# Patient Record
Sex: Male | Born: 1965 | Race: Black or African American | Hispanic: No | Marital: Married | Smoking: Current every day smoker
Health system: Southern US, Community
[De-identification: ages and names within clinical notes are randomized; demographics above are authoritative.]

## PROBLEM LIST (undated history)

## (undated) DIAGNOSIS — I10 Essential (primary) hypertension: Secondary | ICD-10-CM

## (undated) HISTORY — PX: APPENDECTOMY: SHX54

---

## 2011-02-23 ENCOUNTER — Emergency Department (HOSPITAL_COMMUNITY)
Admission: EM | Admit: 2011-02-23 | Discharge: 2011-02-23 | Disposition: A | Discharge status: Home / Self Care | Payer: Self-pay | Attending: Emergency Medicine | Admitting: Emergency Medicine

## 2011-02-23 ENCOUNTER — Emergency Department (HOSPITAL_COMMUNITY): Payer: Self-pay

## 2011-02-23 DIAGNOSIS — S139XXA Sprain of joints and ligaments of unspecified parts of neck, initial encounter: Secondary | ICD-10-CM | POA: Insufficient documentation

## 2011-02-23 DIAGNOSIS — M542 Cervicalgia: Secondary | ICD-10-CM | POA: Insufficient documentation

## 2011-02-23 DIAGNOSIS — Y92410 Unspecified street and highway as the place of occurrence of the external cause: Secondary | ICD-10-CM | POA: Insufficient documentation

## 2011-02-23 DIAGNOSIS — M2550 Pain in unspecified joint: Secondary | ICD-10-CM | POA: Insufficient documentation

## 2011-02-23 DIAGNOSIS — M546 Pain in thoracic spine: Secondary | ICD-10-CM | POA: Insufficient documentation

## 2015-09-30 ENCOUNTER — Emergency Department (HOSPITAL_COMMUNITY)
Admission: EM | Admit: 2015-09-30 | Discharge: 2015-09-30 | Disposition: A | Discharge status: Home / Self Care | Payer: 59 | Attending: Emergency Medicine | Admitting: Emergency Medicine

## 2015-09-30 ENCOUNTER — Emergency Department (HOSPITAL_COMMUNITY): Payer: 59

## 2015-09-30 ENCOUNTER — Encounter (HOSPITAL_COMMUNITY): Payer: Self-pay | Admitting: Cardiology

## 2015-09-30 DIAGNOSIS — H9203 Otalgia, bilateral: Secondary | ICD-10-CM | POA: Diagnosis not present

## 2015-09-30 DIAGNOSIS — J029 Acute pharyngitis, unspecified: Secondary | ICD-10-CM | POA: Diagnosis present

## 2015-09-30 DIAGNOSIS — J069 Acute upper respiratory infection, unspecified: Secondary | ICD-10-CM | POA: Diagnosis not present

## 2015-09-30 DIAGNOSIS — R5383 Other fatigue: Secondary | ICD-10-CM | POA: Diagnosis not present

## 2015-09-30 DIAGNOSIS — B9789 Other viral agents as the cause of diseases classified elsewhere: Secondary | ICD-10-CM

## 2015-09-30 DIAGNOSIS — Z720 Tobacco use: Secondary | ICD-10-CM | POA: Insufficient documentation

## 2015-09-30 MED ORDER — AEROCHAMBER PLUS FLO-VU MEDIUM MISC
1.0000 | Freq: Once | Status: AC
  Administered 2015-09-30: 1
  Filled 2015-09-30 (×2): qty 1

## 2015-09-30 MED ORDER — BENZONATATE 100 MG PO CAPS
200.0000 mg | ORAL_CAPSULE | Freq: Once | ORAL | Status: AC
  Administered 2015-09-30: 200 mg via ORAL
  Filled 2015-09-30: qty 2

## 2015-09-30 MED ORDER — BENZONATATE 100 MG PO CAPS: 100.0000 mg | ORAL_CAPSULE | Freq: Three times a day (TID) | ORAL | Status: DC

## 2015-09-30 MED ORDER — ALBUTEROL SULFATE HFA 108 (90 BASE) MCG/ACT IN AERS
2.0000 | INHALATION_SPRAY | RESPIRATORY_TRACT | Status: DC | PRN
  Administered 2015-09-30: 2 via RESPIRATORY_TRACT
  Filled 2015-09-30: qty 6.7

## 2015-09-30 MED ORDER — FLUTICASONE PROPIONATE 50 MCG/ACT NA SUSP: 2.0000 | Freq: Every day | NASAL | Status: DC

## 2015-09-30 MED ORDER — IBUPROFEN 400 MG PO TABS
800.0000 mg | ORAL_TABLET | Freq: Once | ORAL | Status: AC
  Administered 2015-09-30: 800 mg via ORAL
  Filled 2015-09-30: qty 2

## 2015-09-30 NOTE — ED Provider Notes (Signed)
CSN: [id number]     Arrival date & time 09/30/15  1432 History  By signing my name below, I, Elon SpannerGarrett Cook, attest that this documentation has been prepared under the direction and in the presence of TXU CorpHannah Markiesha Delia, PA-C. Electronically Signed: Elon SpannerGarrett Cook ED Scribe. 09/30/2015. 3:32 PM.    Chief Complaint  Patient presents with  . Cough  . Sore Throat   The history is provided by the patient. No language interpreter was used.   HPI Comments: Dylan Holmes is a 49 y.o. male who presents to the Emergency Department complaining of a moderate cough productive of green/yellow/brown sputum onset two days ago; unrelieved by Nyquil.  Associated symptoms include sore throat, bilateral ear pain, rhinorrhea, headache, body aches, and chills.  The patient reports a positive sick contact with similar symtpoms.  Patient is a 4 cigarette/day smoker.  He consumes 4 beers per week but denies illicit drug use.   NKA.  The patient did not receive a flu shot this year.    FAO:ZHYQPCP:none History reviewed. No pertinent past medical history. Past Surgical History  Procedure Laterality Date  . Appendectomy     History reviewed. No pertinent family history. Social History  Substance Use Topics  . Smoking status: Current Every Day Smoker  . Smokeless tobacco: None  . Alcohol Use: Yes    Review of Systems  Constitutional: Positive for fatigue. Negative for fever, chills and appetite change.  HENT: Positive for congestion, ear pain, postnasal drip, rhinorrhea, sinus pressure and sore throat. Negative for ear discharge and mouth sores.   Eyes: Negative for visual disturbance.  Respiratory: Positive for cough and chest tightness. Negative for shortness of breath, wheezing and stridor.   Cardiovascular: Negative for chest pain, palpitations and leg swelling.  Gastrointestinal: Negative for nausea, vomiting, abdominal pain and diarrhea.  Genitourinary: Negative for dysuria, urgency, frequency and hematuria.   Musculoskeletal: Positive for myalgias. Negative for back pain, arthralgias and neck stiffness.  Skin: Negative for rash.  Neurological: Positive for headaches. Negative for syncope, light-headedness and numbness.  Hematological: Negative for adenopathy.  Psychiatric/Behavioral: The patient is not nervous/anxious.   All other systems reviewed and are negative.     Allergies  Review of patient's allergies indicates no known allergies.  Home Medications   Prior to Admission medications   Medication Sig Start Date End Date Taking? Authorizing Provider  benzonatate (TESSALON) 100 MG capsule Take 1 capsule (100 mg total) by mouth every 8 (eight) hours. 09/30/15   Lesle Faron, PA-C  benzonatate (TESSALON) 100 MG capsule Take 1 capsule (100 mg total) by mouth every 8 (eight) hours. 09/30/15   Calden Dorsey, PA-C  fluticasone (FLONASE) 50 MCG/ACT nasal spray Place 2 sprays into both nostrils daily. 09/30/15   Rebbeca Sheperd, PA-C   BP 175/88 mmHg  Pulse 62  Temp(Src) 98 F (36.7 C) (Oral)  Resp 16  Ht 5\' 10"  (1.778 m)  Wt 198 lb 11.2 oz (90.13 kg)  BMI 28.51 kg/m2  SpO2 97% Physical Exam  Constitutional: He is oriented to person, place, and time. He appears well-developed and well-nourished. No distress.  HENT:  Head: Normocephalic and atraumatic.  Right Ear: Tympanic membrane, external ear and ear canal normal.  Left Ear: Tympanic membrane, external ear and ear canal normal.  Nose: Mucosal edema and rhinorrhea present. No epistaxis. Right sinus exhibits no maxillary sinus tenderness and no frontal sinus tenderness. Left sinus exhibits no maxillary sinus tenderness and no frontal sinus tenderness.  Mouth/Throat: Uvula is midline and mucous  membranes are normal. Mucous membranes are not pale and not cyanotic. No oropharyngeal exudate, posterior oropharyngeal edema, posterior oropharyngeal erythema or tonsillar abscesses.  Eyes: Conjunctivae are normal. Pupils are  equal, round, and reactive to light.  Neck: Normal range of motion and full passive range of motion without pain.  Cardiovascular: Normal rate, normal heart sounds and intact distal pulses.   No murmur heard. Pulmonary/Chest: Effort normal and breath sounds normal. No stridor.  Clear breath sounds in the right upper, right lower, left upper lobes with faint wheezes in the left lower.    Abdominal: Soft. Bowel sounds are normal. There is no tenderness.  Musculoskeletal: Normal range of motion.  Lymphadenopathy:    He has no cervical adenopathy.  Neurological: He is alert and oriented to person, place, and time.  Skin: Skin is warm and dry. No rash noted. He is not diaphoretic.  Psychiatric: He has a normal mood and affect.  Nursing note and vitals reviewed.   ED Course  Procedures (including critical care time)  DIAGNOSTIC STUDIES: Oxygen Saturation is 95% on RA, normal by my interpretation.    COORDINATION OF CARE:  3:00 PM Will order CXR, ibuprofen, albuterol, tessalon.  Patient acknowledges and agrees with plan.    Labs Review Labs Reviewed - No data to display  Imaging Review Dg Chest 2 View  09/30/2015  CLINICAL DATA:  Productive cough, history of tobacco use EXAM: CHEST - 2 VIEW COMPARISON:  None. FINDINGS: The heart size and mediastinal contours are within normal limits. Both lungs are clear. The visualized skeletal structures are unremarkable. IMPRESSION: No active disease. Electronically Signed   By: Alcide Clever M.D.   On: 09/30/2015 15:22   I have personally reviewed and evaluated these images and lab results as part of my medical decision-making.   EKG Interpretation None      MDM   Final diagnoses:  Viral URI with cough  Otalgia of both ears   Dylan Hence presents with URI symptoms.  Pt CXR negative for acute infiltrate. Patients symptoms are consistent with URI, likely viral etiology. Discussed that antibiotics are not indicated for viral infections. Pt  will be discharged with symptomatic treatment.  Verbalizes understanding and is agreeable with plan. Pt is hemodynamically stable & in NAD prior to dc.  BP 175/88 mmHg  Pulse 62  Temp(Src) 98 F (36.7 C) (Oral)  Resp 16  Ht  (1.778 m)  Wt 198 lb 11.2 oz (90.13 kg)  BMI 28.51 kg/m2  SpO2 97%  I personally performed the services described in this documentation, which was scribed in my presence. The recorded information has been reviewed and is accurate.   Dahlia Client Kenyonna Micek, PA-C 09/30/15 1607  Jerelyn Scott, MD 10/01/15 [phone]

## 2015-09-30 NOTE — ED Notes (Signed)
Pt stable, ambulatory, states understanding of discharge instructions 

## 2015-09-30 NOTE — ED Notes (Signed)
Reports sore throat, congestion and cough for the past 2 days. Has tried OTC medication without much relief.

## 2015-09-30 NOTE — Discharge Instructions (Signed)
1. Medications: flonase, mucinex, tessalon, albuterol, usual home medications 2. Treatment: rest, drink plenty of fluids, take tylenol or ibuprofen for fever control 3. Follow Up: Please followup with your primary doctor in 3 days for discussion of your diagnoses and further evaluation after today's visit; if you do not have a primary care doctor use the resource guide provided to find one; Return to the ER for high fevers, difficulty breathing or other concerning symptoms   Upper Respiratory Infection, Adult Most upper respiratory infections (URIs) are a viral infection of the air passages leading to the lungs. A URI affects the nose, throat, and upper air passages. The most common type of URI is nasopharyngitis and is typically referred to as "the common cold." URIs run their course and usually go away on their own. Most of the time, a URI does not require medical attention, but sometimes a bacterial infection in the upper airways can follow a viral infection. This is called a secondary infection. Sinus and middle ear infections are common types of secondary upper respiratory infections. Bacterial pneumonia can also complicate a URI. A URI can worsen asthma and chronic obstructive pulmonary disease (COPD). Sometimes, these complications can require emergency medical care and may be life threatening.  CAUSES Almost all URIs are caused by viruses. A virus is a type of germ and can spread from one person to another.  RISKS FACTORS You may be at risk for a URI if:   You smoke.   You have chronic heart or lung disease.  You have a weakened defense (immune) system.   You are very young or very old.   You have nasal allergies or asthma.  You work in crowded or poorly ventilated areas.  You work in health care facilities or schools. SIGNS AND SYMPTOMS  Symptoms typically develop 2-3 days after you come in contact with a cold virus. Most viral URIs last 7-10 days. However, viral URIs from the  influenza virus (flu virus) can last 14-18 days and are typically more severe. Symptoms may include:   Runny or stuffy (congested) nose.   Sneezing.   Cough.   Sore throat.   Headache.   Fatigue.   Fever.   Loss of appetite.   Pain in your forehead, behind your eyes, and over your cheekbones (sinus pain).  Muscle aches.  DIAGNOSIS  Your health care provider may diagnose a URI by:  Physical exam.  Tests to check that your symptoms are not due to another condition such as:  Strep throat.  Sinusitis.  Pneumonia.  Asthma. TREATMENT  A URI goes away on its own with time. It cannot be cured with medicines, but medicines may be prescribed or recommended to relieve symptoms. Medicines may help:  Reduce your fever.  Reduce your cough.  Relieve nasal congestion. HOME CARE INSTRUCTIONS   Take medicines only as directed by your health care provider.   Gargle warm saltwater or take cough drops to comfort your throat as directed by your health care provider.  Use a warm mist humidifier or inhale steam from a shower to increase air moisture. This may make it easier to breathe.  Drink enough fluid to keep your urine clear or pale yellow.   Eat soups and other clear broths and maintain good nutrition.   Rest as needed.   Return to work when your temperature has returned to normal or as your health care provider advises. You may need to stay home longer to avoid infecting others. You can also  use a face mask and careful hand washing to prevent spread of the virus.  Increase the usage of your inhaler if you have asthma.   Do not use any tobacco products, including cigarettes, chewing tobacco, or electronic cigarettes. If you need help quitting, ask your health care provider. PREVENTION  The best way to protect yourself from getting a cold is to practice good hygiene.   Avoid oral or hand contact with people with cold symptoms.   Wash your hands often if  contact occurs.  There is no clear evidence that vitamin C, vitamin E, echinacea, or exercise reduces the chance of developing a cold. However, it is always recommended to get plenty of rest, exercise, and practice good nutrition.  SEEK MEDICAL CARE IF:   You are getting worse rather than better.   Your symptoms are not controlled by medicine.   You have chills.  You have worsening shortness of breath.  You have brown or red mucus.  You have yellow or brown nasal discharge.  You have pain in your face, especially when you bend forward.  You have a fever.  You have swollen neck glands.  You have pain while swallowing.  You have white areas in the back of your throat. SEEK IMMEDIATE MEDICAL CARE IF:   You have severe or persistent:  Headache.  Ear pain.  Sinus pain.  Chest pain.  You have chronic lung disease and any of the following:  Wheezing.  Prolonged cough.  Coughing up blood.  A change in your usual mucus.  You have a stiff neck.  You have changes in your:  Vision.  Hearing.  Thinking.  Mood. MAKE SURE YOU:   Understand these instructions.  Will watch your condition.  Will get help right away if you are not doing well or get worse.   This information is not intended to replace advice given to you by your health care provider. Make sure you discuss any questions you have with your health care provider.   Document Released: 05/22/2001 Document Revised: 04/12/2015 Document Reviewed: 03/03/2014 Elsevier Interactive Patient Education 2016 [employer]    Emergency Department Resource Guide 1) Find a Doctor and Pay Out of Pocket Although you won't have to find out who is covered by your insurance plan, it is a good idea to ask around and get recommendations. You will then need to call the office and see if the doctor you have chosen will accept you as a new patient and what types of options they offer for patients who are self-pay. Some  doctors offer discounts or will set up payment plans for their patients who do not have insurance, but you will need to ask so you aren't surprised when you get to your appointment.  2) Contact Your Local Health Department Not all health departments have doctors that can see patients for sick visits, but many do, so it is worth a call to see if yours does. If you don't know where your local health department is, you can check in your phone book. The CDC also has a tool to help you locate your state's health department, and many state websites also have listings of all of their local health departments.  3) Find a Walk-in Clinic If your illness is not likely to be very severe or complicated, you may want to try a walk in clinic. These are popping up all over the country in pharmacies, drugstores, and shopping centers. They're usually staffed by nurse practitioners or  physician assistants that have been trained to treat common illnesses and complaints. They're usually fairly quick and inexpensive. However, if you have serious medical issues or chronic medical problems, these are probably not your best option.  No Primary Care Doctor: - Call Health Connect at  [phone] - they can help you locate a primary care doctor that  accepts your insurance, provides certain services, etc. - Physician Referral Service- [phone]  Chronic Pain Problems: Organization         Address  Phone   Notes  [facility]  [phone] Patients need to be referred by their primary care doctor.   Medication Assistance: Organization         Address  Phone   Notes  [facility] Medication [facility] [address] [place], [place] [address] [phone] --Must be a resident of [facility] -- Must have NO insurance coverage whatsoever (no Medicaid/ Medicare, etc.) -- The pt. MUST have a primary care doctor that directs their care regularly and follows them in the  community   MedAssist  [phone]   [employer]  [phone]    Agencies that provide inexpensive medical care: Organization         Address  Phone   Notes  [place]  [phone]   [facility]    [phone]   [facility] [place], [place] [address] [phone]   [place] [address], [place] [phone]   Planned Parenthood    [phone]   [facility]    [phone]   [place] and [address], [place] Phone:  [phone], Fax:  [phone] Hours of Operation:  9 am - 6 pm, M-F.  Also accepts Medicaid/Medicare and self-pay.  [facility] for [place], [address], [place] Phone: [phone], Fax: [phone]. Hours of Operation:  8:30 am - 5:30 pm, M-F.  Also accepts Medicaid and self-pay.  [facility] [place] [address], [place] Phone: [phone]   [place], [place], [place] [phone], Ext. [phone] Mondays & Thursdays: 7-9 AM.  First 15 patients are seen on a first come, first serve basis.    [place] Providers:  Organization         Address  Phone   Notes  [facility] [address], [place] [phone] Also accepts self-pay patients.  [facility] [address], [place]  [phone]   [place], [address], [place] [phone]   [facility] [address] [place] [phone]   Lucianne Lei [address], [place]   [phone] Only accepts [place] Access [place] patients after they have their name applied to their card.   Self-Pay (no insurance) in [facility]:  Organization         Address  Phone   Notes  Sickle Cell Patients, [facility]  Internal Medicine [place] [phone]   [facility] [place] [phone]   [facility] [place]  [address], [place] [phone]   [facility]/Dr. Vista Lawman  [address], [place] or [address], [place] [phone] Phone number for both [place] and [place] locations is the same.  Urgent Medical and [facility] [address] [place] [phone]   [facility] [address], [place] or [address] [phone]   [facility] [address], [place] [phone], phone; [phone], fax Sees patients 1st and 3rd Saturday of every month.  Must not qualify for public or private insurance (i.e. Medicaid, Medicare, [place] Health Choice, Veterans' Benefits)  Household income should be no more than 200% of the poverty level The clinic cannot treat you if you are pregnant or think you are pregnant  Sexually transmitted diseases are not treated at the clinic.    Dental Care: Organization         Address  Phone  Notes  [facility] Department of [facility] [place] [phone] Accepts children up to age 81 who are enrolled in [place] or [place]; pregnant women with a Medicaid card; and children who have applied for Medicaid or [place] Health Choice, but were declined, whose parents can pay a reduced fee at time of service.  [facility] Department of [facility]  [address], [place] [phone] Accepts children up to age 25 who are enrolled in [place] or [place]; pregnant women with a Medicaid card; and children who have applied for Medicaid or [place] Health Choice, but were declined, whose parents can pay a reduced fee at time of service.  [place] Adult Dental Access PROGRAM  [place] [phone] Patients are seen by appointment only. Walk-ins are not accepted. [place] will see patients 76 years of age and older. Monday - Tuesday (8am-5pm) Most Wednesdays (8:30-5pm) $30 per visit, cash only  [facility] Adult Dental Access PROGRAM  [address], [facility] [phone] Patients are seen by appointment only. Walk-ins are not accepted. [place] will see patients 53 years of age and older. One Wednesday Evening (Monthly: Volunteer Based).  $30 per visit, cash only  [place]  [phone] for adults; Children under age 26, call Graduate Pediatric Dentistry at [phone]. Children aged 55-14, please call [phone] to request a pediatric application.  Dental services are provided in all areas of dental care including fillings, crowns and bridges, complete and partial dentures, implants, gum treatment, root canals, and extractions. Preventive care is also provided. Treatment is provided to both adults and children. Patients are selected via a lottery and there is often a waiting list.   [facility] [address], [place]  [phone] [email or web address]   [facility] [address] [place], [place] [phone], Ext. [phone] Second and Fourth Thursday of each month, opens at 6:30 AM; Clinic ends at 9 AM.  Patients are seen on a first-come first-served basis, and a limited number are seen during each clinic.   [facility]  [address] Hillard Danker [place], [place] [phone]   Eligibility Requirements You must have lived in [place], [place], or [place] counties for at least the last three months.   You cannot be eligible for state or federal sponsored [employer], including [employer], [place], or [employer].   You generally cannot be eligible for healthcare insurance through your employer.    How to  apply: Eligibility screenings are held every  Tuesday and Wednesday afternoon from 1:00 pm until 4:00 pm. You do not need an appointment for the interview!  [facility] [address] [place], [place]   [place]  [place] Department  [place]  [phone]    Behavioral Health Resources in the Community: Intensive Outpatient Programs Organization         Address  Phone  Notes  [place]. [address] [place], [place] [phone]   [facility] Outpatient [address], [place], [place]   ADS: Alcohol & Drug Svcs [address] [place], [place]   [place] [address],  [place], [place] or [phone]   Substance Abuse Resources Organization         Address  Phone  Notes  Alcohol and Drug Services  [phone]   [place]  [phone]   The [place]   Chinita Pester  [phone]   Residential & Outpatient Substance Abuse Program  [phone]   Psychological Services Organization         Address  Phone  Notes  [facility] [place]  [place]  [phone]   [place] [address], [place] or [phone]    Mobile Crisis Teams Organization         Address  Phone  Notes  [facility], Mobile Crisis Care Unit  [phone]   Assertive Psychotherapeutic Services  [address] [place], [place]   [place] [address] [place] [phone]    Self-Help/Support Groups Organization         Address  Phone             Notes  [place]. of [place] - variety of support groups  [place] Call for more information  Narcotics Anonymous (NA), [facility] [address], [employer] [place]  2 meetings at this location    Special educational needs teacher         Address  Phone  Notes  ASAP Residential Treatment [place],    [place]  [phone]   [facility]  [address] [place] [phone], [place], [place]   [place], [place] [phone] Admissions: 8am-3pm M-F  Incentives Substance [place] [address]    [place], [place] [phone]   [facility] [address] [place], [place], [place]   [facility] [address],  [place], [place]   [facility]ight Programs - Intensive Outpatient [address], [place], [place]   [facility] ([place]  [place], [place] [phone] or [phone]   [facility] Services (RTS) [address] [place], [place] Accepts Medicaid  [facility] [place] [address],  [place] [phone] Substance Abuse/Addiction Treatment   [facility] Organization         Address  Phone  Notes  [facility]  [phone]   Domenic Schwab, PhD [address] Arlis Porta [place], [place]   [phone] or [phone]   [place]   [address] [place], [place] [phone]   [facility] Recovery [place] [address] [place],  [place] [phone] Insurance/Medicaid/sponsorship through [facility] Families [address], Ste [place], [place] [phone] [place], [place] [phone]    Dr. Adele Schilder  [phone]   [facility] Dept. 1) [address], [place] 2) [place] 3)  [place] [address], [place] [phone]  [phone]   [place] [phone] or [phone] (After  Hours)

## 2016-04-15 ENCOUNTER — Emergency Department (HOSPITAL_COMMUNITY)
Admission: EM | Admit: 2016-04-15 | Discharge: 2016-04-15 | Disposition: A | Discharge status: Home / Self Care | Payer: BLUE CROSS/BLUE SHIELD | Attending: Emergency Medicine | Admitting: Emergency Medicine

## 2016-04-15 ENCOUNTER — Encounter (HOSPITAL_COMMUNITY): Payer: Self-pay | Admitting: Emergency Medicine

## 2016-04-15 ENCOUNTER — Emergency Department (HOSPITAL_COMMUNITY): Payer: BLUE CROSS/BLUE SHIELD

## 2016-04-15 DIAGNOSIS — M25512 Pain in left shoulder: Secondary | ICD-10-CM

## 2016-04-15 DIAGNOSIS — F17200 Nicotine dependence, unspecified, uncomplicated: Secondary | ICD-10-CM | POA: Insufficient documentation

## 2016-04-15 DIAGNOSIS — Z79899 Other long term (current) drug therapy: Secondary | ICD-10-CM | POA: Diagnosis not present

## 2016-04-15 DIAGNOSIS — Z7952 Long term (current) use of systemic steroids: Secondary | ICD-10-CM | POA: Insufficient documentation

## 2016-04-15 DIAGNOSIS — M62838 Other muscle spasm: Secondary | ICD-10-CM | POA: Diagnosis not present

## 2016-04-15 MED ORDER — HYDROMORPHONE HCL 1 MG/ML IJ SOLN
1.0000 mg | Freq: Once | INTRAMUSCULAR | Status: AC
  Administered 2016-04-15: 1 mg via INTRAMUSCULAR
  Filled 2016-04-15: qty 1

## 2016-04-15 MED ORDER — CYCLOBENZAPRINE HCL 10 MG PO TABS
5.0000 mg | ORAL_TABLET | Freq: Once | ORAL | Status: AC
  Administered 2016-04-15: 5 mg via ORAL
  Filled 2016-04-15: qty 1

## 2016-04-15 MED ORDER — OXYCODONE-ACETAMINOPHEN 5-325 MG PO TABS
1.0000 | ORAL_TABLET | Freq: Once | ORAL | Status: AC
  Administered 2016-04-15: 1 via ORAL
  Filled 2016-04-15: qty 1

## 2016-04-15 MED ORDER — CYCLOBENZAPRINE HCL 10 MG PO TABS: 10.0000 mg | ORAL_TABLET | Freq: Two times a day (BID) | ORAL | Status: DC | PRN

## 2016-04-15 MED ORDER — HYDROCODONE-ACETAMINOPHEN 5-325 MG PO TABS: 1.0000 | ORAL_TABLET | Freq: Four times a day (QID) | ORAL | Status: DC | PRN

## 2016-04-15 MED ORDER — PREDNISONE 10 MG PO TABS: 20.0000 mg | ORAL_TABLET | Freq: Every day | ORAL | Status: DC

## 2016-04-15 NOTE — ED Notes (Signed)
C/o pain to L shoulder and decreased ROM since Saturday.  Denies known injury.

## 2016-04-15 NOTE — Discharge Instructions (Signed)
Do not take the muscle relaxant or the narcotic if driving or working because they will make you sleepy.  Call tomorrow for follow up with the orthopedic doctor.  Return here as needed.

## 2016-04-15 NOTE — ED Notes (Signed)
Pt continuing to c/o pain, NP notified

## 2016-04-15 NOTE — ED Provider Notes (Signed)
CSN: [id number]     Arrival date & time 04/15/16  1605 History   First MD Initiated Contact with Patient 04/15/16 1629     Chief Complaint  Patient presents with  . Shoulder Pain     (Consider location/radiation/quality/duration/timing/severity/associated sxs/prior Treatment) HPI Dylan Holmes is a 50 y.o. male who presents to the ED with left shoulder pain and decreased range of motion. The symptoms started yesterday. Patient reports he got up and went to work and when he stated home he noted his shoulder hurting and had difficulty raising it to hold the steering wheel. He thought that he thought he slept wrong but the symptoms continued to worsen. He denies chest pain, shortness of breath or other problems. He does not remember any injury to the area.    History reviewed. No pertinent past medical history. Past Surgical History  Procedure Laterality Date  . Appendectomy     No family history on file. Social History  Substance Use Topics  . Smoking status: Current Every Day Smoker  . Smokeless tobacco: None  . Alcohol Use: Yes    Review of Systems  Musculoskeletal:       Left should pain and decreased range of motion  all other systems negative    Allergies  Review of patient's allergies indicates no known allergies.  Home Medications   Prior to Admission medications   Medication Sig Start Date End Date Taking? Authorizing Provider  benzonatate (TESSALON) 100 MG capsule Take 1 capsule (100 mg total) by mouth every 8 (eight) hours. 09/30/15   Hannah Muthersbaugh, PA-C  benzonatate (TESSALON) 100 MG capsule Take 1 capsule (100 mg total) by mouth every 8 (eight) hours. 09/30/15   Hannah Muthersbaugh, PA-C  cyclobenzaprine (FLEXERIL) 10 MG tablet Take 1 tablet (10 mg total) by mouth 2 (two) times daily as needed for muscle spasms. 04/15/16   Hope Orlene OchM Neese, NP  fluticasone (FLONASE) 50 MCG/ACT nasal spray Place 2 sprays into both nostrils daily. 09/30/15   Hannah Muthersbaugh, PA-C   HYDROcodone-acetaminophen (NORCO) 5-325 MG tablet Take 1 tablet by mouth every 6 (six) hours as needed. 04/15/16   Hope Orlene OchM Neese, NP  predniSONE (DELTASONE) 10 MG tablet Take 2 tablets (20 mg total) by mouth daily. 04/15/16   Hope Orlene OchM Neese, NP   BP 141/94 mmHg  Pulse 69  Temp(Src) 98.8 F (37.1 C) (Oral)  Resp 18  Ht 5\' 10"  (1.778 m)  Wt 91.627 kg  BMI 28.98 kg/m2  SpO2 97% Physical Exam  Constitutional: He is oriented to person, place, and time. He appears well-developed and well-nourished.  HENT:  Head: Normocephalic and atraumatic.  Eyes: Conjunctivae and EOM are normal.  Neck: Normal range of motion. Neck supple.  Cardiovascular: Normal rate and regular rhythm.   Pulmonary/Chest: Effort normal and breath sounds normal.  Musculoskeletal:       Left shoulder: He exhibits tenderness and spasm. He exhibits no swelling, no crepitus, no deformity, no laceration, normal pulse and normal strength. Decreased range of motion: due to pain.  Radial pulses 2+, adequate circulation, equal grips.   Neurological: He is alert and oriented to person, place, and time. No cranial nerve deficit.  Skin: Skin is warm and dry.  Psychiatric: He has a normal mood and affect. His behavior is normal.  Nursing note and vitals reviewed.   ED Course  Procedures (including critical care time)  Xray, pain management, sling  Labs Review Labs Reviewed - No data to display  Imaging Review Dg Shoulder  Left  04/15/2016  CLINICAL DATA:  50 year old male with sudden onset of left shoulder pain after work. Moves furniture. Initial encounter. EXAM: LEFT SHOULDER - 2+ VIEW COMPARISON:  None. FINDINGS: No fracture or dislocation. Moderate acromioclavicular joint degenerative changes with prominent spur of the acromion. Mild glenohumeral joint degenerative changes. Visualized lungs clear. IMPRESSION: No fracture or dislocation. Moderate acromioclavicular joint degenerative changes with prominent spur of the acromion. Mild  glenohumeral joint degenerative changes. Electronically Signed   By: Lacy Duverney M.D.   On: 04/15/2016 18:03   I discussed this case and reviewed the x-rays with Dr. Ethelda Chick.   MDM  50 y.o. male with left shoulder pain stable for d/c without focal neuro deficits and no fracture or dislocation noted on x-ray. He will call ortho tomorrow for follow up. He will return here as needed. Discussed with the patient clinical and x-ray findings and plan of care and all questioned fully answered.  Patient reports he is feeling better at d/c.   Final diagnoses:  Left shoulder pain      Janne Napoleon, NP 04/15/16 2015  Doug Sou, MD 04/16/16 [phone]

## 2016-08-03 ENCOUNTER — Encounter (HOSPITAL_COMMUNITY): Payer: Self-pay

## 2016-08-03 ENCOUNTER — Emergency Department (HOSPITAL_COMMUNITY)
Admission: EM | Admit: 2016-08-03 | Discharge: 2016-08-03 | Disposition: A | Discharge status: Home / Self Care | Payer: BLUE CROSS/BLUE SHIELD | Attending: Emergency Medicine | Admitting: Emergency Medicine

## 2016-08-03 DIAGNOSIS — Z79899 Other long term (current) drug therapy: Secondary | ICD-10-CM | POA: Insufficient documentation

## 2016-08-03 DIAGNOSIS — R112 Nausea with vomiting, unspecified: Secondary | ICD-10-CM

## 2016-08-03 DIAGNOSIS — F17200 Nicotine dependence, unspecified, uncomplicated: Secondary | ICD-10-CM | POA: Diagnosis not present

## 2016-08-03 DIAGNOSIS — R197 Diarrhea, unspecified: Secondary | ICD-10-CM | POA: Diagnosis not present

## 2016-08-03 DIAGNOSIS — Z7982 Long term (current) use of aspirin: Secondary | ICD-10-CM | POA: Insufficient documentation

## 2016-08-03 LAB — URINALYSIS, ROUTINE W REFLEX MICROSCOPIC
Bilirubin Urine: NEGATIVE
GLUCOSE, UA: NEGATIVE mg/dL
HGB URINE DIPSTICK: NEGATIVE
KETONES UR: NEGATIVE mg/dL
Leukocytes, UA: NEGATIVE
Nitrite: NEGATIVE
PH: 5.5 (ref 5.0–8.0)
PROTEIN: NEGATIVE mg/dL
Specific Gravity, Urine: 1.014 (ref 1.005–1.030)

## 2016-08-03 LAB — CBC
HEMATOCRIT: 46.3 % (ref 39.0–52.0)
Hemoglobin: 15.2 g/dL (ref 13.0–17.0)
MCH: 28.7 pg (ref 26.0–34.0)
MCHC: 32.8 g/dL (ref 30.0–36.0)
MCV: 87.4 fL (ref 78.0–100.0)
PLATELETS: 233 10*3/uL (ref 150–400)
RBC: 5.3 MIL/uL (ref 4.22–5.81)
RDW: 12.8 % (ref 11.5–15.5)
WBC: 7.8 10*3/uL (ref 4.0–10.5)

## 2016-08-03 LAB — COMPREHENSIVE METABOLIC PANEL
ALBUMIN: 4.5 g/dL (ref 3.5–5.0)
ALT: 20 U/L (ref 17–63)
AST: 23 U/L (ref 15–41)
Alkaline Phosphatase: 84 U/L (ref 38–126)
Anion gap: 7 (ref 5–15)
BUN: 9 mg/dL (ref 6–20)
CHLORIDE: 109 mmol/L (ref 101–111)
CO2: 25 mmol/L (ref 22–32)
CREATININE: 0.82 mg/dL (ref 0.61–1.24)
Calcium: 9.2 mg/dL (ref 8.9–10.3)
GFR calc Af Amer: >60 mL/min (ref >60)
GFR calc non Af Amer: >60 mL/min (ref >60)
GLUCOSE: 89 mg/dL (ref 65–99)
Potassium: 3.9 mmol/L (ref 3.5–5.1)
SODIUM: 141 mmol/L (ref 135–145)
Total Bilirubin: 0.9 mg/dL (ref 0.3–1.2)
Total Protein: 8 g/dL (ref 6.5–8.1)

## 2016-08-03 LAB — LIPASE, BLOOD: LIPASE: 21 U/L (ref 11–51)

## 2016-08-03 MED ORDER — ONDANSETRON HCL 4 MG PO TABS: 4.0000 mg | ORAL_TABLET | Freq: Four times a day (QID) | ORAL | 0 refills | Status: DC

## 2016-08-03 MED ORDER — ACETAMINOPHEN 500 MG PO TABS
1000.0000 mg | ORAL_TABLET | Freq: Once | ORAL | Status: AC
Start: 2016-08-03 — End: 2016-08-03
  Administered 2016-08-03: 1000 mg via ORAL
  Filled 2016-08-03: qty 2

## 2016-08-03 MED ORDER — SODIUM CHLORIDE 0.9 % IV BOLUS (SEPSIS)
1000.0000 mL | Freq: Once | INTRAVENOUS | Status: AC
  Administered 2016-08-03: 1000 mL via INTRAVENOUS

## 2016-08-03 NOTE — ED Triage Notes (Signed)
Per Pt, Pt is Coming from home with complaints of N/V/D that started two days ago. Reports brown emesis with green diarrhea. Pt denies abdominal pain or urinary symptoms.

## 2016-08-03 NOTE — ED Provider Notes (Signed)
[facility]-EMERGENCY DEPT Provider Note   CSN: [id number] Arrival date & time: 08/03/16  [id number]     History   Chief Complaint Chief Complaint  Patient presents with  . Diarrhea  . Emesis    HPI Dylan Holmes is a 50 y.o. male.  Patient presents to the emergency department with chief complaint of nausea, vomiting, and diarrhea. He states that the symptoms started 2-3 days ago. He states that he did have a little alcohol last night. He states that this also led to some drug use. He denies any fevers, chills, chest pain, shortness breath, or abdominal pain. He states that he is actually starting to feel a little better now. He denies any dysuria or hematuria. Denies any flank pain. There are no modifying factors. He has not taken anything for symptoms.   The history is provided by the patient. No language interpreter was used.    History reviewed. No pertinent past medical history.  There are no active problems to display for this patient.   Past Surgical History:  Procedure Laterality Date  . APPENDECTOMY         Home Medications    Prior to Admission medications   Medication Sig Start Date End Date Taking? Authorizing Provider  aspirin EC 81 MG tablet Take 81 mg by mouth daily.   Yes Historical Provider, MD  fluticasone (FLONASE) 50 MCG/ACT nasal spray Place 2 sprays into both nostrils daily. 09/30/15  Yes Hannah Muthersbaugh, PA-C  OVER THE COUNTER MEDICATION Take 2 tablets by mouth 2 (two) times daily as needed (pain).   Yes Historical Provider, MD  PRESCRIPTION MEDICATION Apply 1 application topically daily as needed (itching. pt does not know cream name but still uses prn).    Yes Historical Provider, MD  benzonatate (TESSALON) 100 MG capsule Take 1 capsule (100 mg total) by mouth every 8 (eight) hours. Patient not taking: Reported on 08/03/2016 09/30/15   Dahlia ClientHannah Muthersbaugh, PA-C  benzonatate (TESSALON) 100 MG capsule Take 1 capsule (100 mg total) by mouth every 8 (eight)  hours. Patient not taking: Reported on 08/03/2016 09/30/15   Dahlia ClientHannah Muthersbaugh, PA-C  cyclobenzaprine (FLEXERIL) 10 MG tablet Take 1 tablet (10 mg total) by mouth 2 (two) times daily as needed for muscle spasms. Patient not taking: Reported on 08/03/2016 04/15/16   Janne NapoleonHope M Neese, NP  HYDROcodone-acetaminophen (NORCO) 5-325 MG tablet Take 1 tablet by mouth every 6 (six) hours as needed. Patient not taking: Reported on 08/03/2016 04/15/16   Janne NapoleonHope M Neese, NP  predniSONE (DELTASONE) 10 MG tablet Take 2 tablets (20 mg total) by mouth daily. Patient not taking: Reported on 08/03/2016 04/15/16   Janne NapoleonHope M Neese, NP    Family History No family history on file.  Social History Social History  Substance Use Topics  . Smoking status: Current Every Day Smoker    Packs/day: 1.00  . Smokeless tobacco: Never Used  . Alcohol use Yes     Allergies   Review of patient's allergies indicates no known allergies.   Review of Systems Review of Systems  Gastrointestinal: Positive for diarrhea, nausea and vomiting.  All other systems reviewed and are negative.    Physical Exam Updated Vital Signs BP 156/94 (BP Location: Right Arm)   Pulse 73   Temp 98.3 F (36.8 C) (Oral)   Resp 18   Ht 5' 10.5" (1.791 m)   Wt 93 kg   SpO2 99%   BMI 29.00 kg/m   Physical Exam  Constitutional: He is oriented  to person, place, and time. He appears well-developed and well-nourished.  HENT:  Head: Normocephalic and atraumatic.  Eyes: Conjunctivae and EOM are normal. Pupils are equal, round, and reactive to light. Right eye exhibits no discharge. Left eye exhibits no discharge. No scleral icterus.  Neck: Normal range of motion. Neck supple. No JVD present.  Cardiovascular: Normal rate, regular rhythm and normal heart sounds.  Exam reveals no gallop and no friction rub.   No murmur heard. Pulmonary/Chest: Effort normal and breath sounds normal. No respiratory distress. He has no wheezes. He has no rales. He exhibits no  tenderness.  Abdominal: Soft. He exhibits no distension and no mass. There is no tenderness. There is no rebound and no guarding.  No focal abdominal tenderness, no RLQ tenderness or pain at McBurney's point, no RUQ tenderness or Murphy's sign, no left-sided abdominal tenderness, no fluid wave, or signs of peritonitis   Musculoskeletal: Normal range of motion. He exhibits no edema or tenderness.  Neurological: He is alert and oriented to person, place, and time.  Skin: Skin is warm and dry.  Psychiatric: He has a normal mood and affect. His behavior is normal. Judgment and thought content normal.  Nursing note and vitals reviewed.    ED Treatments / Results  Labs (all labs ordered are listed, but only abnormal results are displayed) Labs Reviewed  LIPASE, BLOOD  COMPREHENSIVE METABOLIC PANEL  CBC  URINALYSIS, ROUTINE W REFLEX MICROSCOPIC (NOT AT [facility])    EKG  EKG Interpretation None       Radiology No results found.  Procedures Procedures (including critical care time)  Medications Ordered in ED Medications  sodium chloride 0.9 % bolus 1,000 mL (not administered)     Initial Impression / Assessment and Plan / ED Course  I have reviewed the triage vital signs and the nursing notes.  Pertinent labs & imaging results that were available during my care of the patient were reviewed by me and considered in my medical decision making (see chart for details).  Clinical Course    Patient with nausea and vomiting and diarrhea that started 2-3 days ago. He reports that his symptoms worsened after drink some alcohol yesterday. He states that he is feeling improved since being in the ED. He is tolerating orals. Laboratory workup is unremarkable. He has no focal abdominal pain. He is well-appearing, not in any apparent distress. I doubt surgical or acute abdomen. As patient is feeling better, I feel that he is stable for outpatient follow-up. Patient understands agrees the plan. He  is stable and ready for discharge.  Final Clinical Impressions(s) / ED Diagnoses   Final diagnoses:  Nausea vomiting and diarrhea    New Prescriptions New Prescriptions   ONDANSETRON (ZOFRAN) 4 MG TABLET    Take 1 tablet (4 mg total) by mouth every 6 (six) hours.     Roxy Horseman, PA-C 08/03/16 1355    Nira Conn, MD 08/04/16 [phone]

## 2017-03-26 ENCOUNTER — Emergency Department (HOSPITAL_COMMUNITY): Payer: BLUE CROSS/BLUE SHIELD

## 2017-03-26 ENCOUNTER — Encounter (HOSPITAL_COMMUNITY): Payer: Self-pay | Admitting: Emergency Medicine

## 2017-03-26 ENCOUNTER — Emergency Department (HOSPITAL_COMMUNITY)
Admission: EM | Admit: 2017-03-26 | Discharge: 2017-03-26 | Disposition: A | Discharge status: Home / Self Care | Payer: BLUE CROSS/BLUE SHIELD | Attending: Emergency Medicine | Admitting: Emergency Medicine

## 2017-03-26 DIAGNOSIS — M25522 Pain in left elbow: Secondary | ICD-10-CM | POA: Diagnosis present

## 2017-03-26 DIAGNOSIS — Y939 Activity, unspecified: Secondary | ICD-10-CM | POA: Diagnosis not present

## 2017-03-26 DIAGNOSIS — Z7982 Long term (current) use of aspirin: Secondary | ICD-10-CM | POA: Insufficient documentation

## 2017-03-26 DIAGNOSIS — J069 Acute upper respiratory infection, unspecified: Secondary | ICD-10-CM | POA: Insufficient documentation

## 2017-03-26 DIAGNOSIS — F17200 Nicotine dependence, unspecified, uncomplicated: Secondary | ICD-10-CM | POA: Diagnosis not present

## 2017-03-26 DIAGNOSIS — M7022 Olecranon bursitis, left elbow: Secondary | ICD-10-CM

## 2017-03-26 MED ORDER — HYDROCOD POLST-CPM POLST ER 10-8 MG/5ML PO SUER
5.0000 mL | Freq: Once | ORAL | Status: AC
  Administered 2017-03-26: 5 mL via ORAL
  Filled 2017-03-26: qty 5

## 2017-03-26 MED ORDER — PREDNISONE 10 MG PO TABS
20.0000 mg | ORAL_TABLET | Freq: Two times a day (BID) | ORAL | 0 refills | Status: DC
Start: 2017-03-26 — End: 2020-11-13

## 2017-03-26 MED ORDER — BENZONATATE 200 MG PO CAPS: 200.0000 mg | ORAL_CAPSULE | Freq: Three times a day (TID) | ORAL | 0 refills | Status: DC | PRN

## 2017-03-26 NOTE — ED Notes (Signed)
Patient transported to X-ray 

## 2017-03-26 NOTE — ED Triage Notes (Signed)
Pt arrives with c/o "knot" to L elbow, boggy pocket of fluid at joint. No hx of same. CMS intact, normal strength. Also reports yesterday when he sneezed a bit of blood was noted in his tissue. Has not occurred again since yesterday.

## 2017-03-26 NOTE — ED Provider Notes (Signed)
[facility]-EM[facility]Y DEPT Provider Note   CSN: [id number] Arrival date & time: 03/26/17  1946  By signing my name below, I, Linna Darner, attest that this documentation has been prepared under the direction and in the presence of [facility] M. Damian Leavell, NP. Electronically Signed: Linna Darner, Scribe. 03/26/2017. 9:26 PM.  History   Chief Complaint Chief Complaint  Patient presents with  . Elbow Pain  . Nasal Congestion    The history is provided by the patient. No language interpreter was used.  Extremity Pain  This is a new problem. The current episode started more than 2 days ago. The problem occurs constantly. The problem has not changed since onset.Pertinent negatives include no chest pain, no abdominal pain, no headaches and no shortness of breath. Exacerbated by: applied pressure. Nothing relieves the symptoms. He has tried nothing for the symptoms.  Cough  This is a new problem. The current episode started more than 2 days ago. The problem occurs every few minutes. The problem has been gradually worsening. The cough is productive of sputum. There has been no fever. Associated symptoms include sore throat. Pertinent negatives include no chest pain, no chills, no ear pain, no headaches, no myalgias and no shortness of breath. He has tried nothing for the symptoms. He is a smoker. His past medical history does not include bronchitis, pneumonia, bronchiectasis, COPD, emphysema or asthma.    HPI Comments: Dylan Holmes is a 51 y.o. male who presents to the Emergency Department complaining of constant left elbow pain for a few days. He reports associated swelling. No known trauma to his elbow. He states his pain is worse with applied pressure to his left elbow. No medications or treatments tried. He denies numbness/tingling, weakness, or any other associated symptoms.  He is also complaining of a persistent cough productive of sputum for a few days. He states he noticed that his sputum was  blood-tinged yesterday. No other episodes of blood in his sputum. Pt notes associated sore throat and nasal congestion. He reports that his grandchild is sick with similar symptoms. Pt is a regular smoker. He denies nausea, vomiting, diarrhea, fevers, chills, ear pain, body aches, or any other associated symptoms.  History reviewed. No pertinent past medical history.  There are no active problems to display for this patient.   Past Surgical History:  Procedure Laterality Date  . APPENDECTOMY         Home Medications    Prior to Admission medications   Medication Sig Start Date End Date Taking? Authorizing Provider  aspirin EC 81 MG tablet Take 81 mg by mouth daily.    Historical Provider, MD  benzonatate (TESSALON) 200 MG capsule Take 1 capsule (200 mg total) by mouth 3 (three) times daily as needed for cough. 03/26/17   Hope Orlene Och, NP  fluticasone (FLONASE) 50 MCG/ACT nasal spray Place 2 sprays into both nostrils daily. 09/30/15   Hannah Muthersbaugh, PA-C  ondansetron (ZOFRAN) 4 MG tablet Take 1 tablet (4 mg total) by mouth every 6 (six) hours. 08/03/16   Roxy Horseman, PA-C  OVER THE COUNTER MEDICATION Take 2 tablets by mouth 2 (two) times daily as needed (pain).    Historical Provider, MD  predniSONE (DELTASONE) 10 MG tablet Take 2 tablets (20 mg total) by mouth 2 (two) times daily with a meal. 03/26/17   Hope Orlene Och, NP  PRESCRIPTION MEDICATION Apply 1 application topically daily as needed (itching. pt does not know cream name but still uses prn).  Historical Provider, MD    Family History History reviewed. No pertinent family history.  Social History Social History  Substance Use Topics  . Smoking status: Current Every Day Smoker    Packs/day: 1.00  . Smokeless tobacco: Never Used  . Alcohol use Yes     Allergies   Patient has no known allergies.   Review of Systems Review of Systems  Constitutional: Negative for chills and fever.  HENT: Positive for  congestion and sore throat. Negative for ear pain.   Eyes: Negative for visual disturbance.  Respiratory: Positive for cough. Negative for shortness of breath.   Cardiovascular: Negative for chest pain.  Gastrointestinal: Negative for abdominal pain, diarrhea, nausea and vomiting.  Genitourinary: Negative for dysuria.  Musculoskeletal: Positive for arthralgias and joint swelling. Negative for myalgias.  Skin: Negative for wound.  Neurological: Negative for weakness, numbness and headaches.  Psychiatric/Behavioral: Negative for confusion.   Physical Exam Updated Vital Signs BP 139/86 (BP Location: Right Arm)   Pulse 67   Temp 98.4 F (36.9 C) (Oral)   Resp 18   Ht 5' 10.5" (1.791 m)   SpO2 99%   Physical Exam  Constitutional: He is oriented to person, place, and time. He appears well-developed and well-nourished. No distress.  HENT:  Head: Normocephalic and atraumatic.  Right Ear: Tympanic membrane normal.  Left Ear: Tympanic membrane normal.  Nose: Rhinorrhea present.  Mouth/Throat: Uvula is midline, oropharynx is clear and moist and mucous membranes are normal.  Eyes: Conjunctivae and EOM are normal.  Neck: Normal range of motion. Neck supple. No tracheal deviation present.  Cardiovascular: Normal rate and regular rhythm.   Pulses:      Radial pulses are 2+ on the right side, and 2+ on the left side.  Adequate circulation.  Pulmonary/Chest: Effort normal. He has no wheezes. He has no rales.  Lungs CTA.  Musculoskeletal: Normal range of motion. He exhibits no deformity.       Left elbow: He exhibits swelling (mild). He exhibits normal range of motion, no deformity and no laceration. Tenderness found. Olecranon process tenderness noted.  Full flexion and extension of the left elbow. Slightly increased warmth to posterior aspect of the left elbow bursa. Small amount of swelling. Radial pulses 2+, adequate circulation. Equal grips.  Neurological: He is alert and oriented to  person, place, and time.  Grip strengths are equal bilaterally.  Skin: Skin is warm and dry.  Psychiatric: He has a normal mood and affect. His behavior is normal.  Nursing note and vitals reviewed.  ED Treatments / Results  Labs (all labs ordered are listed, but only abnormal results are displayed) Labs Reviewed - No data to display   Radiology Dg Chest 2 View  Result Date: 03/26/2017 CLINICAL DATA:  Central chest pain and congestion for 5 days. Current smoker. EXAM: CHEST  2 VIEW COMPARISON:  09/30/2015 FINDINGS: The heart size and mediastinal contours are within normal limits. Both lungs are clear. The visualized skeletal structures are unremarkable. IMPRESSION: No active cardiopulmonary disease. Electronically Signed   By: Burman Nieves M.D.   On: 03/26/2017 22:08   Dg Elbow Complete Left  Result Date: 03/26/2017 CLINICAL DATA:  Left elbow pain and swelling. No injury. Current smoker. EXAM: LEFT ELBOW - COMPLETE 3+ VIEW COMPARISON:  None. 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 These are likely degenerative. No evidence of acute fracture or dislocation. No significant effusion. No focal bone lesion or bone destruction.  Bone cortex appears intact. IMPRESSION: No acute bony abnormalities. 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 Electronically Signed   By: Burman Nieves M.D.   On: 03/26/2017 22:08    Procedures Procedures (including critical care time)  DIAGNOSTIC STUDIES: Oxygen Saturation is 96% on RA, adequate by my interpretation.    COORDINATION OF CARE: 9:34 PM Discussed treatment plan with pt at bedside and pt agreed to plan.  Medications Ordered in ED Medications  chlorpheniramine-HYDROcodone (TUSSIONEX) 10-8 MG/5ML suspension 5 mL (5 mLs Oral Given 03/26/17 2239)     Initial Impression / Assessment and Plan / ED  Course  I have reviewed the triage vital signs and the nursing notes.  Pertinent imaging results that were available during my care of the patient were reviewed by me and considered in my medical decision making (see chart for details).   Final Clinical Impressions(s) / ED Diagnoses  51 y.o. male with URI symptoms and swelling of left elbow stable for d/c without focal neuro deficits and no respiratory distress. Will treat for URI and bursitis. Patient to f/u with his PCP or return here as needed for problems. Referral to hand surgeon given if symptoms of the elbow persist.  Final diagnoses:  Olecranon bursitis of left elbow  URI, acute    New Prescriptions Discharge Medication List as of 03/26/2017 10:26 PM    I personally performed the services described in this documentation, which was scribed in my presence. The recorded information has been reviewed and is accurate.    [id number] [place], NP 03/27/17 2440    Doug Sou, MD 03/27/17 [phone]

## 2017-03-26 NOTE — ED Triage Notes (Signed)
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

## 2018-01-21 ENCOUNTER — Encounter (HOSPITAL_BASED_OUTPATIENT_CLINIC_OR_DEPARTMENT_OTHER): Payer: Self-pay | Admitting: Emergency Medicine

## 2018-01-21 ENCOUNTER — Other Ambulatory Visit: Payer: Self-pay

## 2018-01-21 ENCOUNTER — Emergency Department (HOSPITAL_BASED_OUTPATIENT_CLINIC_OR_DEPARTMENT_OTHER)
Admission: EM | Admit: 2018-01-21 | Discharge: 2018-01-21 | Disposition: A | Discharge status: Home / Self Care | Payer: BLUE CROSS/BLUE SHIELD | Attending: Emergency Medicine | Admitting: Emergency Medicine

## 2018-01-21 DIAGNOSIS — Y939 Activity, unspecified: Secondary | ICD-10-CM | POA: Insufficient documentation

## 2018-01-21 DIAGNOSIS — Z7982 Long term (current) use of aspirin: Secondary | ICD-10-CM | POA: Diagnosis not present

## 2018-01-21 DIAGNOSIS — F17200 Nicotine dependence, unspecified, uncomplicated: Secondary | ICD-10-CM | POA: Insufficient documentation

## 2018-01-21 DIAGNOSIS — S39012A Strain of muscle, fascia and tendon of lower back, initial encounter: Secondary | ICD-10-CM | POA: Diagnosis not present

## 2018-01-21 DIAGNOSIS — Y929 Unspecified place or not applicable: Secondary | ICD-10-CM | POA: Insufficient documentation

## 2018-01-21 DIAGNOSIS — X58XXXA Exposure to other specified factors, initial encounter: Secondary | ICD-10-CM | POA: Insufficient documentation

## 2018-01-21 DIAGNOSIS — S3992XA Unspecified injury of lower back, initial encounter: Secondary | ICD-10-CM | POA: Diagnosis present

## 2018-01-21 DIAGNOSIS — Y999 Unspecified external cause status: Secondary | ICD-10-CM | POA: Insufficient documentation

## 2018-01-21 LAB — URINALYSIS, ROUTINE W REFLEX MICROSCOPIC
Bilirubin Urine: NEGATIVE
Glucose, UA: NEGATIVE mg/dL
Hgb urine dipstick: NEGATIVE
Ketones, ur: NEGATIVE mg/dL
LEUKOCYTES UA: NEGATIVE
NITRITE: NEGATIVE
PH: 6 (ref 5.0–8.0)
Protein, ur: NEGATIVE mg/dL
SPECIFIC GRAVITY, URINE: 1.015 (ref 1.005–1.030)

## 2018-01-21 MED ORDER — CYCLOBENZAPRINE HCL 10 MG PO TABS: 10.0000 mg | ORAL_TABLET | Freq: Two times a day (BID) | ORAL | 0 refills | Status: DC | PRN

## 2018-01-21 MED ORDER — NAPROXEN 500 MG PO TABS: 500.0000 mg | ORAL_TABLET | Freq: Two times a day (BID) | ORAL | 0 refills | Status: DC

## 2018-01-21 MED FILL — CYCLOBENZAPRINE HCL 10 MG T: 10 | 10 days supply | Qty: 20 | Fill #0

## 2018-01-21 MED FILL — NAPROXEN 500 MG TABLET: 500 | 15 days supply | Qty: 30 | Fill #0

## 2018-01-21 NOTE — ED Provider Notes (Signed)
[facility] EMERGENCY DEPARTMENT Provider Note   CSN: [id number] Arrival date & time: 01/21/18  1438     History   Chief Complaint Chief Complaint  Patient presents with  . Back Pain    HPI Dylan Holmes is a 52 y.o. male with no significant past medical history, who presents to ED for evaluation of bilateral lower back pain for the past 3 weeks.  Describes the pain as aching and worse with movement and palpation.  Pain does not radiate.  He has been having a normal gait since the pain began.  But he states that he is here today because his wife encouraged him to do so.  He has been taking a few doses of Tylenol with no improvement in his symptoms.  He initially thought that it was due to possible kidney stones or UTI.  He has been drinking a lot of water lately to try to help with that.  He denies any dysuria, hematuria, history of kidney disease, history of kidney stones, history of cancer, history of IV drug use, numbness in legs, loss of bowel or bladder function, prior back surgeries, changes in gait or fever.  HPI  History reviewed. No pertinent past medical history.  There are no active problems to display for this patient.   Past Surgical History:  Procedure Laterality Date  . APPENDECTOMY         Home Medications    Prior to Admission medications   Medication Sig Start Date End Date Taking? Authorizing Provider  aspirin EC 81 MG tablet Take 81 mg by mouth daily.    [provider]  benzonatate (TESSALON) 200 MG capsule Take 1 capsule (200 mg total) by mouth 3 (three) times daily as needed for cough. 03/26/17   Janne Napoleon, NP  cyclobenzaprine (FLEXERIL) 10 MG tablet Take 1 tablet (10 mg total) by mouth 2 (two) times daily as needed for muscle spasms. 01/21/18   Ric Rosenberg, PA-C  fluticasone (FLONASE) 50 MCG/ACT nasal spray Place 2 sprays into both nostrils daily. 09/30/15   Muthersbaugh, Dahlia Client, PA-C  naproxen (NAPROSYN) 500 MG tablet Take 1  tablet (500 mg total) by mouth 2 (two) times daily. 01/21/18   Kym Fenter, PA-C  ondansetron (ZOFRAN) 4 MG tablet Take 1 tablet (4 mg total) by mouth every 6 (six) hours. 08/03/16   Roxy Horseman, PA-C  OVER THE COUNTER MEDICATION Take 2 tablets by mouth 2 (two) times daily as needed (pain).    [provider]  predniSONE (DELTASONE) 10 MG tablet Take 2 tablets (20 mg total) by mouth 2 (two) times daily with a meal. 03/26/17   Janne Napoleon, NP  PRESCRIPTION MEDICATION Apply 1 application topically daily as needed (itching. pt does not know cream name but still uses prn).     [provider]    Family History History reviewed. No pertinent family history.  Social History Social History   Tobacco Use  . Smoking status: Current Every Day Smoker    Packs/day: 1.00  . Smokeless tobacco: Never Used  Substance Use Topics  . Alcohol use: Yes  . Drug use: No     Allergies   Patient has no known allergies.   Review of Systems Review of Systems  Constitutional: Negative for chills and fever.  Genitourinary: Negative for dysuria, flank pain, hematuria and testicular pain.  Musculoskeletal: Positive for back pain and myalgias. Negative for arthralgias, gait problem, joint swelling, neck pain and neck stiffness.  Skin: Negative  for wound.  Neurological: Negative for weakness and numbness.     Physical Exam Updated Vital Signs BP (!) 161/95 (BP Location: Left Arm)   Pulse 77   Temp 98.5 F (36.9 C) (Oral)   Resp 18   Ht 5' 10.5" (1.791 m)   Wt 95.7 kg (211 lb)   SpO2 100%   BMI 29.85 kg/m   Physical Exam  Constitutional: He appears well-developed and well-nourished. No distress.  HENT:  Head: Normocephalic and atraumatic.  Eyes: Conjunctivae and EOM are normal. No scleral icterus.  Neck: Normal range of motion.  Pulmonary/Chest: Effort normal. No respiratory distress.  Musculoskeletal: Normal range of motion. He exhibits tenderness. He exhibits no edema  or deformity.       Back:  No midline spinal tenderness present in lumbar, thoracic or cervical spine. No step-off palpated. No visible bruising, edema or temperature change noted. No objective signs of numbness present. No saddle anesthesia. 2+ DP pulses bilaterally. Sensation intact to light touch. Strength 5/5 in bilateral lower extremities.  Neurological: He is alert.  Skin: No rash noted. He is not diaphoretic.  Psychiatric: He has a normal mood and affect.  Nursing note and vitals reviewed.    ED Treatments / Results  Labs (all labs ordered are listed, but only abnormal results are displayed) Labs Reviewed  URINE CULTURE  URINALYSIS, ROUTINE W REFLEX MICROSCOPIC    EKG  EKG Interpretation None       Radiology No results found.  Procedures Procedures (including critical care time)  Medications Ordered in ED Medications - No data to display   Initial Impression / Assessment and Plan / ED Course  I have reviewed the triage vital signs and the nursing notes.  Pertinent labs & imaging results that were available during my care of the patient were reviewed by me and considered in my medical decision making (see chart for details).     Patient presents to ED for evaluation of 3-week history of bilateral lower back pain.  Cannot recall any inciting event.  Denies any red flags for back pain including fever, history of cancer, history of IV drug use, numbness in legs, loss of bowel or bladder function, dysuria, prior back surgeries.  He has been amatory with normal gait here.  He does have muscular tenderness to palpation in the lumbar area that appears consistent with lumbar strain.  He is neurovascularly intact in bilateral lower extremities. I have low suspicion for cauda equina or other spinal cord injury or infectious process as the cause of his symptoms.  Will give anti-inflammatories, muscle relaxer and advised [facility]  if symptoms persist.  Given instructions on heat therapy.  Patient appears stable for discharge at this time.  Strict return precautions given.  Portions of this note were generated with Scientist, clinical (histocompatibility and immunogenetics)Dragon dictation software. Dictation errors may occur despite best attempts at proofreading.   Final Clinical Impressions(s) / ED Diagnoses   Final diagnoses:  Strain of lumbar region, initial encounter    ED Discharge Orders        Ordered    naproxen (NAPROSYN) 500 MG tablet  2 times daily     01/21/18 1722    cyclobenzaprine (FLEXERIL) 10 MG tablet  2 times daily PRN     01/21/18 1722       Dietrich PatesKhatri, Jadzia Ibsen, PA-C 01/21/18 1724    Doug SouJacubowitz, Sam, MD 01/22/18 0021

## 2018-01-21 NOTE — ED Triage Notes (Signed)
Patient states that he is having pain to his lower back worse with movement - Paitent states that he has been drinking a lot of water lately

## 2018-01-21 NOTE — Discharge Instructions (Signed)
Please read attached information regarding your condition and heat therapy. Take Flexeril naproxen as directed scheduled for the next 2-3 days. Stretch areas as tolerated.  Massaging the area may also help. [facility] if symptoms persist. Return to ED for worse symptoms, numbness in legs, loss of bowel or bladder function, inability to walk, injuries or falls.

## 2018-01-22 LAB — URINE CULTURE

## 2018-02-10 ENCOUNTER — Other Ambulatory Visit: Payer: Self-pay

## 2018-02-10 ENCOUNTER — Encounter (HOSPITAL_BASED_OUTPATIENT_CLINIC_OR_DEPARTMENT_OTHER): Payer: Self-pay | Admitting: Emergency Medicine

## 2018-02-10 ENCOUNTER — Emergency Department (HOSPITAL_BASED_OUTPATIENT_CLINIC_OR_DEPARTMENT_OTHER)
Admission: EM | Admit: 2018-02-10 | Discharge: 2018-02-10 | Disposition: A | Discharge status: Home / Self Care | Payer: BLUE CROSS/BLUE SHIELD | Attending: Emergency Medicine | Admitting: Emergency Medicine

## 2018-02-10 DIAGNOSIS — J069 Acute upper respiratory infection, unspecified: Secondary | ICD-10-CM | POA: Insufficient documentation

## 2018-02-10 DIAGNOSIS — B9789 Other viral agents as the cause of diseases classified elsewhere: Secondary | ICD-10-CM | POA: Insufficient documentation

## 2018-02-10 DIAGNOSIS — Z7982 Long term (current) use of aspirin: Secondary | ICD-10-CM | POA: Insufficient documentation

## 2018-02-10 DIAGNOSIS — F17200 Nicotine dependence, unspecified, uncomplicated: Secondary | ICD-10-CM | POA: Insufficient documentation

## 2018-02-10 NOTE — ED Provider Notes (Signed)
[facility] EMERGENCY DEPARTMENT Provider Note   CSN: [id number] Arrival date & time: 02/10/18  1247     History   Chief Complaint Chief Complaint  Patient presents with  . Cough  . Nasal Congestion    HPI Dylan Holmes is a 52 y.o. male with history of tobacco use is here for evaluation of subjective fever, nasal congestion with rhinorrhea, mild dry cough for 3days. Had diarrhea for 24 hours yesterday that resolved. He states his granddaughter had the flu recently. He denies any sputum production, sore throat, exertional or pleuritic chest pain, abdominal pain, urinary symptoms. No interventions PTA.  HPI   History reviewed. No pertinent past medical history.  There are no active problems to display for this patient.   Past Surgical History:  Procedure Laterality Date  . APPENDECTOMY         Home Medications    Prior to Admission medications   Medication Sig Start Date End Date Taking? Authorizing Provider  aspirin EC 81 MG tablet Take 81 mg by mouth daily.    [provider]  benzonatate (TESSALON) 200 MG capsule Take 1 capsule (200 mg total) by mouth 3 (three) times daily as needed for cough. 03/26/17   Janne NapoleonNeese, Hope M, NP  cyclobenzaprine (FLEXERIL) 10 MG tablet Take 1 tablet (10 mg total) by mouth 2 (two) times daily as needed for muscle spasms. 01/21/18   Khatri, Hina, PA-C  fluticasone (FLONASE) 50 MCG/ACT nasal spray Place 2 sprays into both nostrils daily. 09/30/15   Muthersbaugh, Dahlia ClientHannah, PA-C  naproxen (NAPROSYN) 500 MG tablet Take 1 tablet (500 mg total) by mouth 2 (two) times daily. 01/21/18   Khatri, Hina, PA-C  ondansetron (ZOFRAN) 4 MG tablet Take 1 tablet (4 mg total) by mouth every 6 (six) hours. 08/03/16   Roxy HorsemanBrowning, Robert, PA-C  OVER THE COUNTER MEDICATION Take 2 tablets by mouth 2 (two) times daily as needed (pain).    [provider]  predniSONE (DELTASONE) 10 MG tablet Take 2 tablets (20 mg total) by mouth 2 (two) times daily  with a meal. 03/26/17   Janne NapoleonNeese, Hope M, NP  PRESCRIPTION MEDICATION Apply 1 application topically daily as needed (itching. pt does not know cream name but still uses prn).     [provider]    Family History History reviewed. No pertinent family history.  Social History Social History   Tobacco Use  . Smoking status: Current Every Day Smoker    Packs/day: 1.00  . Smokeless tobacco: Never Used  Substance Use Topics  . Alcohol use: Yes  . Drug use: No     Allergies   Patient has no known allergies.   Review of Systems Review of Systems  HENT: Positive for congestion, postnasal drip and rhinorrhea.   Respiratory: Positive for cough.   Gastrointestinal: Positive for diarrhea (Resolved).  All other systems reviewed and are negative.    Physical Exam Updated Vital Signs BP (!) 175/98 (BP Location: Left Arm)   Pulse 61   Temp 98.5 F (36.9 C) (Oral)   Resp 20   SpO2 100%   Physical Exam  Constitutional: He is oriented to person, place, and time. He appears well-developed and well-nourished. No distress.  NAD.  HENT:  Head: Normocephalic and atraumatic.  Right Ear: External ear normal.  Left Ear: External ear normal.  Nose: Nose normal.  Moderate mucosal edema with clear rhinorrhea. Oropharynx mildly erythematous. Tonsils normal. Moist mucous membranes. Tympanic membranes normal.  Eyes: Conjunctivae and EOM  are normal. No scleral icterus.  Neck: Normal range of motion. Neck supple.  Right, nontender swollen submandibular lymph node  Cardiovascular: Normal rate, regular rhythm, normal heart sounds and intact distal pulses.  No murmur heard. Pulmonary/Chest: Effort normal and breath sounds normal. He has no wheezes.  Abdominal: Soft. There is no tenderness.  No suprapubic or CVA tenderness.  Musculoskeletal: Normal range of motion. He exhibits no deformity.  Neurological: He is alert and oriented to person, place, and time.  Skin: Skin is warm and dry.  Capillary refill takes less than 2 seconds.  Psychiatric: He has a normal mood and affect. His behavior is normal. Judgment and thought content normal.  Nursing note and vitals reviewed.    ED Treatments / Results  Labs (all labs ordered are listed, but only abnormal results are displayed) Labs Reviewed - No data to display  EKG  EKG Interpretation None       Radiology No results found.  Procedures Procedures (including critical care time)  Medications Ordered in ED Medications - No data to display   Initial Impression / Assessment and Plan / ED Course  I have reviewed the triage vital signs and the nursing notes.  Pertinent labs & imaging results that were available during my care of the patient were reviewed by me and considered in my medical decision making (see chart for details).     52 y.o. -year-old male  W/ h/o tobacco abuse presents with URI like symptoms  3 days. Known sick contacts. On my exam patient is nontoxic appearing, speaking in full sentences, w/o increased WOB. No fever, tachypnea, tachycardia, hypoxia. Lungs are CTAB. I do not think that a CXR is indicated at this time as VS are WNL, there are no signs of consolidation on auscultation and there is no hypoxia. No significant h/o immunocompromise. Doubt pneumonia.  Given reassuring physical exam, will discharge with symptomatic treatment. Strict ED return precautions given. Patient is aware that a viral URI infection may precede pneumonia or worsening illness. Patient is aware of red flag symptoms to monitor for that would warrant return to the ED for further reevaluation.   Final Clinical Impressions(s) / ED Diagnoses   Final diagnoses:  Viral upper respiratory illness    ED Discharge Orders    None       Jerrell Mylar 02/10/18 1540    Long, Arlyss Repress, MD 02/10/18 2003

## 2018-02-10 NOTE — ED Triage Notes (Signed)
Reports cough and congestion x 2 days.  Denies fever. 

## 2018-02-10 NOTE — Discharge Instructions (Signed)
Your symptoms are most consistent with a viral upper respiratory infection, possibly influenza.  The main treatment approach for a viral upper respiratory infection and/or influenza is to treat the symptoms, support your immune system and prevent spread of illness. Stay well-hydrated. Rest. Take ibuprofen or Tylenol around the clock to help with associated fevers, sore throat, headaches, generalized body aches and malaise. Use an over-the-counter nasal steroid spray (like Flonase) to help with nasal congestion, runny nose and postnasal drip.  Wash your hands often to prevent spread.  A viral upper respiratory infection and/or influenza typically lasts 5-7 days.  Symptoms resolve slowly.  However, a viral upper respiratory infection can also worsen and progress into pneumonia.  Monitor your symptoms. If your symptoms worsen, persist and you develop persistent fevers, chest pain, productive cough you should follow up with your primary care provider.

## 2018-04-15 ENCOUNTER — Emergency Department (HOSPITAL_BASED_OUTPATIENT_CLINIC_OR_DEPARTMENT_OTHER)
Admission: EM | Admit: 2018-04-15 | Discharge: 2018-04-15 | Disposition: A | Discharge status: Home / Self Care | Payer: Self-pay | Attending: Emergency Medicine | Admitting: Emergency Medicine

## 2018-04-15 ENCOUNTER — Emergency Department (HOSPITAL_BASED_OUTPATIENT_CLINIC_OR_DEPARTMENT_OTHER): Payer: Self-pay

## 2018-04-15 ENCOUNTER — Other Ambulatory Visit: Payer: Self-pay

## 2018-04-15 ENCOUNTER — Encounter (HOSPITAL_BASED_OUTPATIENT_CLINIC_OR_DEPARTMENT_OTHER): Payer: Self-pay | Admitting: *Deleted

## 2018-04-15 DIAGNOSIS — R0789 Other chest pain: Secondary | ICD-10-CM | POA: Insufficient documentation

## 2018-04-15 DIAGNOSIS — R059 Cough, unspecified: Secondary | ICD-10-CM

## 2018-04-15 DIAGNOSIS — Z79899 Other long term (current) drug therapy: Secondary | ICD-10-CM | POA: Insufficient documentation

## 2018-04-15 DIAGNOSIS — F17210 Nicotine dependence, cigarettes, uncomplicated: Secondary | ICD-10-CM | POA: Insufficient documentation

## 2018-04-15 DIAGNOSIS — R05 Cough: Secondary | ICD-10-CM | POA: Insufficient documentation

## 2018-04-15 DIAGNOSIS — Z7982 Long term (current) use of aspirin: Secondary | ICD-10-CM | POA: Insufficient documentation

## 2018-04-15 NOTE — ED Provider Notes (Signed)
[facility] EMERGENCY DEPARTMENT Provider Note  CSN: [id number] Arrival date & time: 04/15/18 1228  Chief Complaint(s) Cough  HPI Dylan Holmes is a 52 y.o. male smoker who presents to the emergency department with 2 days of productive cough of yellowish-brown sputum.  Endorses chest discomfort only with coughing.  Otherwise chest pain-free.  He denies any fevers, chills, nausea, vomiting, shortness of breath, abdominal pain.  Denies any sick contacts.  Denies any recent travels.  HPI  Past Medical History History reviewed. No pertinent past medical history. There are no active problems to display for this patient.  Home Medication(s) Prior to Admission medications   Medication Sig Start Date End Date Taking? Authorizing Provider  aspirin EC 81 MG tablet Take 81 mg by mouth daily.    [provider]  benzonatate (TESSALON) 200 MG capsule Take 1 capsule (200 mg total) by mouth 3 (three) times daily as needed for cough. 03/26/17   Janne Napoleon, NP  cyclobenzaprine (FLEXERIL) 10 MG tablet Take 1 tablet (10 mg total) by mouth 2 (two) times daily as needed for muscle spasms. 01/21/18   Khatri, Hina, PA-C  fluticasone (FLONASE) 50 MCG/ACT nasal spray Place 2 sprays into both nostrils daily. 09/30/15   Muthersbaugh, Dahlia Client, PA-C  naproxen (NAPROSYN) 500 MG tablet Take 1 tablet (500 mg total) by mouth 2 (two) times daily. 01/21/18   Khatri, Hina, PA-C  ondansetron (ZOFRAN) 4 MG tablet Take 1 tablet (4 mg total) by mouth every 6 (six) hours. 08/03/16   Roxy Horseman, PA-C  OVER THE COUNTER MEDICATION Take 2 tablets by mouth 2 (two) times daily as needed (pain).    [provider]  predniSONE (DELTASONE) 10 MG tablet Take 2 tablets (20 mg total) by mouth 2 (two) times daily with a meal. 03/26/17   Janne Napoleon, NP  PRESCRIPTION MEDICATION Apply 1 application topically daily as needed (itching. pt does not know cream name but still uses prn).     [provider]                                                                                                                                      Past Surgical History Past Surgical History:  Procedure Laterality Date  . APPENDECTOMY     Family History No family history on file.  Social History Social History   Tobacco Use  . Smoking status: Current Every Day Smoker    Packs/day: 1.00  . Smokeless tobacco: Never Used  Substance Use Topics  . Alcohol use: Yes  . Drug use: No   Allergies Patient has no known allergies.  Review of Systems Review of Systems All other systems are reviewed and are negative for acute change except as noted in the HPI  Physical Exam Vital Signs  I have reviewed the triage vital signs BP (!) 150/93   Pulse 67   Temp 98.6 F (37  C) (Oral)   Resp 20   Ht 5' 10.5" (1.791 m)   Wt 96.4 kg (212 lb 7 oz)   SpO2 98%   BMI 30.05 kg/m   Physical Exam  Constitutional: He is oriented to person, place, and time. He appears well-developed and well-nourished. No distress.  HENT:  Head: Normocephalic and atraumatic.  Nose: Nose normal.  Eyes: Pupils are equal, round, and reactive to light. Conjunctivae and EOM are normal. Right eye exhibits no discharge. Left eye exhibits no discharge. No scleral icterus.  Neck: Normal range of motion. Neck supple.  Cardiovascular: Normal rate and regular rhythm. Exam reveals no gallop and no friction rub.  No murmur heard. Pulmonary/Chest: Effort normal and breath sounds normal. No stridor. No respiratory distress. He has no rales.  Abdominal: Soft. He exhibits no distension. There is no tenderness.  Musculoskeletal: He exhibits no edema or tenderness.  Neurological: He is alert and oriented to person, place, and time.  Skin: Skin is warm and dry. No rash noted. He is not diaphoretic. No erythema.  Psychiatric: He has a normal mood and affect.  Vitals reviewed.   ED Results and Treatments Labs (all labs ordered are listed, but  only abnormal results are displayed) Labs Reviewed - No data to display                                                                                                                       EKG  EKG Interpretation  Date/Time:    Ventricular Rate:    PR Interval:    QRS Duration:   QT Interval:    QTC Calculation:   R Axis:     Text Interpretation:        Radiology Dg Chest 2 View  Result Date: 04/15/2018 CLINICAL DATA:  Productive cough. EXAM: CHEST - 2 VIEW COMPARISON:  Radiographs of March 26, 2017. FINDINGS: The heart size and mediastinal contours are within normal limits. Both lungs are clear. No pneumothorax or pleural effusion is noted. The visualized skeletal structures are unremarkable. IMPRESSION: No active cardiopulmonary disease. Electronically Signed   By: Lupita Raider, M.D.   On: 04/15/2018 13:33   Pertinent labs & imaging results that were available during my care of the patient were reviewed by me and considered in my medical decision making (see chart for details).  Medications Ordered in ED Medications - No data to display  Procedures Procedures Counseled patient for approximately 5 minutes regarding smoking cessation. Discussed risks of smoking and how they applied and affected their visit here today. Patient not ready to quit at this time, however will follow up with their primary doctor when they are. Provided with resources.  CPT code: 81191: intermediate counseling for smoking cessation    (including critical care time)  Medical Decision Making / ED Course I have reviewed the nursing notes for this encounter and the patient's prior records (if available in EHR or on provided paperwork).    Patient is well-appearing, well-hydrated, nontoxic.  Afebrile with stable vital signs.  Lungs clear to auscultation bilaterally.  No evidence  of upper respiratory infection.  Chest x-ray without evidence of pneumonia.  Viral  process versus seasonal allergies versus smoker's cough.  Patient counseled on smoking cessation as above.  No indication for antibiotics at this time.  The patient appears reasonably screened and/or stabilized for discharge and I doubt any other medical condition or other [facility] requiring further screening, evaluation, or treatment in the ED at this time prior to discharge.  The patient is safe for discharge with strict return precautions.   Final Clinical Impression(s) / ED Diagnoses Final diagnoses:  Cough    Disposition: Discharge  Condition: Good  I have discussed the results, Dx and Tx plan with the patient who expressed understanding and agree(s) with the plan. Discharge instructions discussed at great length. The patient was given strict return precautions who verbalized understanding of the instructions. No further questions at time of discharge.    ED Discharge Orders    None       Follow Up: Primary care provider   If you do not have a primary care physician, contact [facility] at [phone] for referral     This chart was dictated using voice recognition software.  Despite best efforts to proofread,  errors can occur which can change the documentation meaning.   Nira Conn, MD 04/15/18 [id number]

## 2018-04-15 NOTE — ED Triage Notes (Signed)
Cough with dark colored sputum. Chest soreness.

## 2018-04-15 NOTE — ED Notes (Signed)
NAD at this time. Pt is stable and going home.  

## 2018-04-22 ENCOUNTER — Other Ambulatory Visit: Payer: Self-pay

## 2018-04-22 ENCOUNTER — Emergency Department (HOSPITAL_COMMUNITY): Payer: Self-pay

## 2018-04-22 ENCOUNTER — Encounter (HOSPITAL_COMMUNITY): Payer: Self-pay | Admitting: Emergency Medicine

## 2018-04-22 ENCOUNTER — Emergency Department (HOSPITAL_COMMUNITY)
Admission: EM | Admit: 2018-04-22 | Discharge: 2018-04-22 | Disposition: A | Discharge status: Home / Self Care | Payer: Self-pay | Attending: Emergency Medicine | Admitting: Emergency Medicine

## 2018-04-22 DIAGNOSIS — F17210 Nicotine dependence, cigarettes, uncomplicated: Secondary | ICD-10-CM | POA: Insufficient documentation

## 2018-04-22 DIAGNOSIS — Z79899 Other long term (current) drug therapy: Secondary | ICD-10-CM | POA: Insufficient documentation

## 2018-04-22 DIAGNOSIS — R197 Diarrhea, unspecified: Secondary | ICD-10-CM

## 2018-04-22 DIAGNOSIS — I10 Essential (primary) hypertension: Secondary | ICD-10-CM

## 2018-04-22 DIAGNOSIS — Z7982 Long term (current) use of aspirin: Secondary | ICD-10-CM | POA: Insufficient documentation

## 2018-04-22 DIAGNOSIS — R05 Cough: Secondary | ICD-10-CM

## 2018-04-22 DIAGNOSIS — B349 Viral infection, unspecified: Secondary | ICD-10-CM

## 2018-04-22 DIAGNOSIS — R059 Cough, unspecified: Secondary | ICD-10-CM

## 2018-04-22 LAB — CBC WITH DIFFERENTIAL/PLATELET
ABS IMMATURE GRANULOCYTES: 0 10*3/uL (ref 0.0–0.1)
Basophils Absolute: 0 10*3/uL (ref 0.0–0.1)
Basophils Relative: 0 %
Eosinophils Absolute: 0.1 10*3/uL (ref 0.0–0.7)
Eosinophils Relative: 1 %
HCT: 45.9 % (ref 39.0–52.0)
Hemoglobin: 15 g/dL (ref 13.0–17.0)
Immature Granulocytes: 0 %
LYMPHS ABS: 2.4 10*3/uL (ref 0.7–4.0)
Lymphocytes Relative: 27 %
MCH: 28.2 pg (ref 26.0–34.0)
MCHC: 32.7 g/dL (ref 30.0–36.0)
MCV: 86.3 fL (ref 78.0–100.0)
MONO ABS: 0.7 10*3/uL (ref 0.1–1.0)
MONOS PCT: 8 %
Neutro Abs: 5.7 10*3/uL (ref 1.7–7.7)
Neutrophils Relative %: 64 %
PLATELETS: 235 10*3/uL (ref 150–400)
RBC: 5.32 MIL/uL (ref 4.22–5.81)
RDW: 12.1 % (ref 11.5–15.5)
WBC: 8.9 10*3/uL (ref 4.0–10.5)

## 2018-04-22 LAB — COMPREHENSIVE METABOLIC PANEL
ALT: 16 U/L — AB (ref 17–63)
AST: 19 U/L (ref 15–41)
Albumin: 4.3 g/dL (ref 3.5–5.0)
Alkaline Phosphatase: 96 U/L (ref 38–126)
Anion gap: 12 (ref 5–15)
BUN: 10 mg/dL (ref 6–20)
CO2: 25 mmol/L (ref 22–32)
Calcium: 9.1 mg/dL (ref 8.9–10.3)
Chloride: 106 mmol/L (ref 101–111)
Creatinine, Ser: 0.9 mg/dL (ref 0.61–1.24)
GFR calc Af Amer: >60 mL/min (ref >60)
GFR calc non Af Amer: >60 mL/min (ref >60)
GLUCOSE: 68 mg/dL (ref 65–99)
Potassium: 3.8 mmol/L (ref 3.5–5.1)
SODIUM: 143 mmol/L (ref 135–145)
Total Bilirubin: 0.6 mg/dL (ref 0.3–1.2)
Total Protein: 7.7 g/dL (ref 6.5–8.1)

## 2018-04-22 LAB — URINALYSIS, ROUTINE W REFLEX MICROSCOPIC
Bilirubin Urine: NEGATIVE
GLUCOSE, UA: NEGATIVE mg/dL
HGB URINE DIPSTICK: NEGATIVE
Ketones, ur: NEGATIVE mg/dL
Leukocytes, UA: NEGATIVE
Nitrite: NEGATIVE
PH: 6 (ref 5.0–8.0)
PROTEIN: NEGATIVE mg/dL
SPECIFIC GRAVITY, URINE: 1.01 (ref 1.005–1.030)

## 2018-04-22 LAB — I-STAT CG4 LACTIC ACID, ED: LACTIC ACID, VENOUS: 1.32 mmol/L (ref 0.5–1.9)

## 2018-04-22 LAB — I-STAT TROPONIN, ED: Troponin i, poc: 0.01 ng/mL (ref 0.00–0.08)

## 2018-04-22 MED ORDER — PROMETHAZINE-DM 6.25-15 MG/5ML PO SYRP: 5.0000 mL | ORAL_SOLUTION | Freq: Four times a day (QID) | ORAL | 0 refills | Status: DC | PRN

## 2018-04-22 MED ORDER — BENZONATATE 200 MG PO CAPS: 200.0000 mg | ORAL_CAPSULE | Freq: Three times a day (TID) | ORAL | 0 refills | Status: DC | PRN

## 2018-04-22 MED ORDER — GI COCKTAIL ~~LOC~~
30.0000 mL | Freq: Once | ORAL | Status: AC
  Administered 2018-04-22: 30 mL via ORAL
  Filled 2018-04-22: qty 30

## 2018-04-22 MED ORDER — ONDANSETRON HCL 4 MG PO TABS: 4.0000 mg | ORAL_TABLET | Freq: Three times a day (TID) | ORAL | 0 refills | Status: DC | PRN

## 2018-04-22 NOTE — ED Notes (Signed)
Pt tolerating water.  
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
## 2018-04-22 NOTE — ED Triage Notes (Signed)
Patient complains of chest pain, diarrhea, loss of appetite, and productive cough that started 4 days ago. Patient reports yellow sputum. Denies any other complaints.

## 2018-04-22 NOTE — ED Notes (Signed)
Pt came to check-in desk requesting to know where he is in line. Told him this NS will have to check with the RN. He stated that he has to pick up his grandchildren before 7pm.

## 2018-04-22 NOTE — Discharge Instructions (Addendum)
You likely have a viral illness.  This should be treated symptomatically. Use Tylenol or ibuprofen as needed for fevers or body aches. Use tessalon perles and cough syrup as needed. Use zofran as needed for nausea or poor appetite.  Make sure you stay well-hydrated with water. Wash your hands frequently to prevent spread of infection. Follow-up with a primary care doctor in 1 week for further evaluation of your symptoms and your blood pressure. Return to the emergency room if you develop chest pain, difficulty breathing, vision changes, weakness, or any new or worsening symptoms.

## 2020-02-11 ENCOUNTER — Emergency Department (HOSPITAL_COMMUNITY)
Admission: EM | Admit: 2020-02-11 | Discharge: 2020-02-11 | Disposition: A | Discharge status: Home / Self Care | Payer: Self-pay | Attending: Emergency Medicine | Admitting: Emergency Medicine

## 2020-02-11 ENCOUNTER — Other Ambulatory Visit: Payer: Self-pay

## 2020-02-11 ENCOUNTER — Encounter (HOSPITAL_COMMUNITY): Payer: Self-pay | Admitting: Pharmacy Technician

## 2020-02-11 ENCOUNTER — Emergency Department (HOSPITAL_COMMUNITY): Payer: Self-pay

## 2020-02-11 DIAGNOSIS — Z7982 Long term (current) use of aspirin: Secondary | ICD-10-CM | POA: Insufficient documentation

## 2020-02-11 DIAGNOSIS — F17210 Nicotine dependence, cigarettes, uncomplicated: Secondary | ICD-10-CM | POA: Insufficient documentation

## 2020-02-11 DIAGNOSIS — K5792 Diverticulitis of intestine, part unspecified, without perforation or abscess without bleeding: Secondary | ICD-10-CM | POA: Insufficient documentation

## 2020-02-11 DIAGNOSIS — Z79899 Other long term (current) drug therapy: Secondary | ICD-10-CM | POA: Insufficient documentation

## 2020-02-11 DIAGNOSIS — R10815 Periumbilic abdominal tenderness: Secondary | ICD-10-CM | POA: Insufficient documentation

## 2020-02-11 LAB — CBC
HCT: 42.4 % (ref 39.0–52.0)
Hemoglobin: 13.8 g/dL (ref 13.0–17.0)
MCH: 28.3 pg (ref 26.0–34.0)
MCHC: 32.5 g/dL (ref 30.0–36.0)
MCV: 87.1 fL (ref 80.0–100.0)
Platelets: 220 10*3/uL (ref 150–400)
RBC: 4.87 MIL/uL (ref 4.22–5.81)
RDW: 12.3 % (ref 11.5–15.5)
WBC: 5.7 10*3/uL (ref 4.0–10.5)
nRBC: 0 % (ref 0.0–0.2)

## 2020-02-11 LAB — URINALYSIS, ROUTINE W REFLEX MICROSCOPIC
Bilirubin Urine: NEGATIVE
Glucose, UA: NEGATIVE mg/dL
Hgb urine dipstick: NEGATIVE
Ketones, ur: NEGATIVE mg/dL
Leukocytes,Ua: NEGATIVE
Nitrite: NEGATIVE
Protein, ur: NEGATIVE mg/dL
Specific Gravity, Urine: 1.014 (ref 1.005–1.030)
pH: 5 (ref 5.0–8.0)

## 2020-02-11 LAB — COMPREHENSIVE METABOLIC PANEL
ALT: 15 U/L (ref 0–44)
AST: 14 U/L — ABNORMAL LOW (ref 15–41)
Albumin: 3.8 g/dL (ref 3.5–5.0)
Alkaline Phosphatase: 80 U/L (ref 38–126)
Anion gap: 11 (ref 5–15)
BUN: 11 mg/dL (ref 6–20)
CO2: 23 mmol/L (ref 22–32)
Calcium: 8.8 mg/dL — ABNORMAL LOW (ref 8.9–10.3)
Chloride: 107 mmol/L (ref 98–111)
Creatinine, Ser: 1.02 mg/dL (ref 0.61–1.24)
GFR calc Af Amer: >60 mL/min (ref >60)
GFR calc non Af Amer: >60 mL/min (ref >60)
Glucose, Bld: 102 mg/dL — ABNORMAL HIGH (ref 70–99)
Potassium: 3.8 mmol/L (ref 3.5–5.1)
Sodium: 141 mmol/L (ref 135–145)
Total Bilirubin: 0.8 mg/dL (ref 0.3–1.2)
Total Protein: 7 g/dL (ref 6.5–8.1)

## 2020-02-11 LAB — LIPASE, BLOOD: Lipase: 23 U/L (ref 11–51)

## 2020-02-11 MED ORDER — AMOXICILLIN-POT CLAVULANATE 875-125 MG PO TABS: 1.0000 | ORAL_TABLET | Freq: Two times a day (BID) | ORAL | 0 refills | Status: DC

## 2020-02-11 MED ORDER — SODIUM CHLORIDE 0.9% FLUSH: 3.0000 mL | Freq: Once | INTRAVENOUS | Status: DC

## 2020-02-11 MED ORDER — AMOXICILLIN-POT CLAVULANATE 875-125 MG PO TABS
1.0000 | ORAL_TABLET | Freq: Once | ORAL | Status: AC
  Administered 2020-02-11: 1 via ORAL
  Filled 2020-02-11: qty 1

## 2020-02-11 MED ORDER — MORPHINE SULFATE (PF) 4 MG/ML IV SOLN
4.0000 mg | Freq: Once | INTRAVENOUS | Status: AC
  Administered 2020-02-11: 4 mg via INTRAVENOUS
  Filled 2020-02-11: qty 1

## 2020-02-11 MED ORDER — IOHEXOL 300 MG/ML  SOLN
100.0000 mL | Freq: Once | INTRAMUSCULAR | Status: AC | PRN
  Administered 2020-02-11: 100 mL via INTRAVENOUS

## 2020-02-11 NOTE — Discharge Instructions (Signed)
Take Augmentin as prescribed.  Follow-up with your primary care provider in 2 days for continued evaluation.  Return to the emergency room immediately for new or worsening symptoms or concerns, such as new or worsening pain, fevers, vomiting or any concerns at all.

## 2020-02-11 NOTE — ED Triage Notes (Signed)
Pt arrives via pov with reports of LLQ abdominal pain X3 days. Pt denies NVD. Denies fevers.
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
## 2020-02-11 NOTE — ED Notes (Signed)
Patient verbalizes understanding of discharge instructions. Opportunity for questioning and answers were provided. Armband removed by staff, pt discharged from ED ambulatory.   

## 2020-11-12 ENCOUNTER — Encounter (HOSPITAL_COMMUNITY): Payer: Self-pay | Admitting: Emergency Medicine

## 2020-11-12 ENCOUNTER — Observation Stay (HOSPITAL_COMMUNITY)
Admission: EM | Admit: 2020-11-12 | Discharge: 2020-11-13 | Disposition: A | Discharge status: Home / Self Care | Payer: BLUE CROSS/BLUE SHIELD | Attending: Internal Medicine | Admitting: Internal Medicine

## 2020-11-12 ENCOUNTER — Other Ambulatory Visit: Payer: Self-pay

## 2020-11-12 ENCOUNTER — Emergency Department (HOSPITAL_COMMUNITY): Payer: BLUE CROSS/BLUE SHIELD

## 2020-11-12 DIAGNOSIS — I10 Essential (primary) hypertension: Secondary | ICD-10-CM

## 2020-11-12 DIAGNOSIS — R079 Chest pain, unspecified: Secondary | ICD-10-CM

## 2020-11-12 DIAGNOSIS — Z7982 Long term (current) use of aspirin: Secondary | ICD-10-CM

## 2020-11-12 DIAGNOSIS — K209 Esophagitis, unspecified without bleeding: Principal | ICD-10-CM | POA: Diagnosis present

## 2020-11-12 DIAGNOSIS — I252 Old myocardial infarction: Secondary | ICD-10-CM | POA: Insufficient documentation

## 2020-11-12 DIAGNOSIS — Z20822 Contact with and (suspected) exposure to covid-19: Secondary | ICD-10-CM | POA: Insufficient documentation

## 2020-11-12 DIAGNOSIS — F17210 Nicotine dependence, cigarettes, uncomplicated: Secondary | ICD-10-CM | POA: Insufficient documentation

## 2020-11-12 DIAGNOSIS — Z79899 Other long term (current) drug therapy: Secondary | ICD-10-CM | POA: Insufficient documentation

## 2020-11-12 DIAGNOSIS — I214 Non-ST elevation (NSTEMI) myocardial infarction: Secondary | ICD-10-CM | POA: Diagnosis present

## 2020-11-12 DIAGNOSIS — I200 Unstable angina: Secondary | ICD-10-CM | POA: Diagnosis not present

## 2020-11-12 LAB — BASIC METABOLIC PANEL
Anion gap: 13 (ref 5–15)
BUN: 10 mg/dL (ref 6–20)
CO2: 24 mmol/L (ref 22–32)
Calcium: 9.3 mg/dL (ref 8.9–10.3)
Chloride: 105 mmol/L (ref 98–111)
Creatinine, Ser: 1.1 mg/dL (ref 0.61–1.24)
GFR, Estimated: >60 mL/min (ref >60)
Glucose, Bld: 110 mg/dL — ABNORMAL HIGH (ref 70–99)
Potassium: 3.6 mmol/L (ref 3.5–5.1)
Sodium: 142 mmol/L (ref 135–145)

## 2020-11-12 LAB — CBC
HCT: 45.9 % (ref 39.0–52.0)
Hemoglobin: 14.8 g/dL (ref 13.0–17.0)
MCH: 28.3 pg (ref 26.0–34.0)
MCHC: 32.2 g/dL (ref 30.0–36.0)
MCV: 87.8 fL (ref 80.0–100.0)
Platelets: 236 10*3/uL (ref 150–400)
RBC: 5.23 MIL/uL (ref 4.22–5.81)
RDW: 12.7 % (ref 11.5–15.5)
WBC: 8.3 10*3/uL (ref 4.0–10.5)
nRBC: 0 % (ref 0.0–0.2)

## 2020-11-12 LAB — TROPONIN I (HIGH SENSITIVITY)
Troponin I (High Sensitivity): 25 ng/L — ABNORMAL HIGH (ref <18)
Troponin I (High Sensitivity): 19 ng/L — ABNORMAL HIGH (ref <18)

## 2020-11-12 MED ORDER — HEPARIN BOLUS VIA INFUSION
4000.0000 [IU] | Freq: Once | INTRAVENOUS | Status: AC
  Administered 2020-11-13: 4000 [IU] via INTRAVENOUS
  Filled 2020-11-12: qty 4000

## 2020-11-12 MED ORDER — MUPIROCIN CALCIUM 2 % EX CREA
TOPICAL_CREAM | Freq: Once | CUTANEOUS | Status: AC
  Filled 2020-11-12: qty 15

## 2020-11-12 MED ORDER — ACETAMINOPHEN 325 MG PO TABS
650.0000 mg | ORAL_TABLET | Freq: Once | ORAL | Status: AC
  Administered 2020-11-12: 650 mg via ORAL
  Filled 2020-11-12: qty 2

## 2020-11-12 MED ORDER — ASPIRIN 81 MG PO CHEW
324.0000 mg | CHEWABLE_TABLET | Freq: Once | ORAL | Status: AC
  Administered 2020-11-13: 324 mg via ORAL
  Filled 2020-11-12: qty 4

## 2020-11-12 MED ORDER — NITROGLYCERIN 0.4 MG SL SUBL
0.4000 mg | SUBLINGUAL_TABLET | SUBLINGUAL | Status: DC | PRN
  Administered 2020-11-13 (×2): 0.4 mg via SUBLINGUAL
  Filled 2020-11-12 (×3): qty 1

## 2020-11-12 MED ORDER — HEPARIN (PORCINE) 25000 UT/250ML-% IV SOLN
1700.0000 [IU]/h | INTRAVENOUS | Status: DC
  Administered 2020-11-13: 1500 [IU]/h via INTRAVENOUS
  Administered 2020-11-13: 1250 [IU]/h via INTRAVENOUS
  Filled 2020-11-12 (×2): qty 250

## 2020-11-12 NOTE — H&P (Signed)
Cardiology Admission History and Physical:   Patient ID: Dylan Holmes MRN: [id number]; DOB: 1966-11-22   Admission date: 11/12/2020  Primary Care Provider: Pcp, No [facility] Cardiologist: No primary care provider on file. [facility] Electrophysiologist:  None   Chief Complaint: chest pain   Patient Profile:   58M with HTN and tobacco use who presents with acute onset chest pain/pressure.  History of Present Illness:   [name] reported that he was at his home sitting in a recliner watching TV on 12/04 evening he experienced acute onset severe chest pressure with associated squeezing sensation with radiation to his left arm and bilateral neck.  His wife gave him some OTC medication for indigestion however shortly thereafter he had 1 episode of emesis.  He says that he has no history of reflux or known heart disease.  He works as a Museum/gallery exhibitions officer at [employer] and is extremely active with work walking with normal levels of day.  He denies any exertional limitations prior to presentation.  Following episode of chest pain he experienced sensation of an elephant sitting on his chest of 9/10 severity.  He smokes 1/4 pack of cigarettes daily and at his most has smoked 0.5 ppd however he has smoked majority of his life since age 28.  He has no family history of premature CAD or SCD.  On presentation to the ED he was still complaining of 6-8/10 chest pain.  He was given 2 sublingual nitroglycerin which improved his pain substantially to a 3/10.  Troponins were only mildly elevated without a significant delta 4 hours after his initial symptom onset.  History reviewed. No pertinent past medical history.  Past Surgical History:  Procedure Laterality Date  . APPENDECTOMY      Medications Prior to Admission: Prior to Admission medications   Medication Sig Start Date End Date Taking? Authorizing Provider  amoxicillin-clavulanate (AUGMENTIN) 875-125 MG tablet Take 1 tablet by mouth every 12  (twelve) hours. 02/11/20   Kendrick, Caitlyn S, PA-C  aspirin EC 81 MG tablet Take 81 mg by mouth daily.    [provider]  benzonatate (TESSALON) 200 MG capsule Take 1 capsule (200 mg total) by mouth 3 (three) times daily as needed for cough. 04/22/18   Caccavale, Sophia, PA-C  cyclobenzaprine (FLEXERIL) 10 MG tablet Take 1 tablet (10 mg total) by mouth 2 (two) times daily as needed for muscle spasms. Patient not taking: Reported on 04/22/2018 01/21/18   Dietrich Pates, PA-C  fluticasone (FLONASE) 50 MCG/ACT nasal spray Place 2 sprays into both nostrils daily. Patient not taking: Reported on 04/22/2018 09/30/15   Muthersbaugh, Dahlia Client, PA-C  naproxen (NAPROSYN) 500 MG tablet Take 1 tablet (500 mg total) by mouth 2 (two) times daily. Patient not taking: Reported on 04/22/2018 01/21/18   Dietrich Pates, PA-C  ondansetron (ZOFRAN) 4 MG tablet Take 1 tablet (4 mg total) by mouth every 8 (eight) hours as needed for nausea or vomiting. 04/22/18   Caccavale, Sophia, PA-C  predniSONE (DELTASONE) 10 MG tablet Take 2 tablets (20 mg total) by mouth 2 (two) times daily with a meal. Patient not taking: Reported on 04/22/2018 03/26/17   Janne Napoleon, NP  PRESCRIPTION MEDICATION Apply 1 application topically daily as needed (Used for burn on his toe).     [provider]  promethazine-dextromethorphan (PROMETHAZINE-DM) 6.25-15 MG/5ML syrup Take 5 mLs by mouth 4 (four) times daily as needed. 04/22/18   Caccavale, Sophia, PA-C    Allergies:   No Known Allergies  Social History:   Social History   Socioeconomic History  . Marital status: Single    Spouse name: Not on file  . Number of children: Not on file  . Years of education: Not on file  . Highest education level: Not on file  Occupational History  . Not on file  Tobacco Use  . Smoking status: Current Every Day Smoker    Packs/day: 1.00  . Smokeless tobacco: Never Used  Substance and Sexual Activity  . Alcohol use: Yes  . Drug use: Yes     Types: Marijuana  . Sexual activity: Not on file  Other Topics Concern  . Not on file  Social History Narrative  . Not on file   Social Determinants of Health   Financial Resource Strain:   . Difficulty of Paying Living Expenses: Not on file  Food Insecurity:   . Worried About Programme researcher, broadcasting/film/video in the Last Year: Not on file  . Ran Out of Food in the Last Year: Not on file  Transportation Needs:   . Lack of Transportation (Medical): Not on file  . Lack of Transportation (Non-Medical): Not on file  Physical Activity:   . Days of Exercise per Week: Not on file  . Minutes of Exercise per Session: Not on file  Stress:   . Feeling of Stress : Not on file  Social Connections:   . Frequency of Communication with Friends and Family: Not on file  . Frequency of Social Gatherings with Friends and Family: Not on file  . Attends Religious Services: Not on file  . Active Member of Clubs or Organizations: Not on file  . Attends Banker Meetings: Not on file  . Marital Status: Not on file  Intimate Partner Violence:   . Fear of Current or Ex-Partner: Not on file  . Emotionally Abused: Not on file  . Physically Abused: Not on file  . Sexually Abused: Not on file    Family History:   The patient's family history is not on file.    ROS:   Review of Systems: [y] = yes, [ ]  = no       General: Weight gain [ ] ; Weight loss [ ] ; Anorexia [ ] ; Fatigue [ ] ; Fever [ ] ; Chills [ ] ; Weakness [ ]     Cardiac: Chest pain/pressure [y]; Resting SOB [ ] ; Exertional SOB [ ] ; Orthopnea [ ] ; Pedal Edema [ ] ; Palpitations [ ] ; Syncope [ ] ; Presyncope [ ] ; Paroxysmal nocturnal dyspnea [ ]     Pulmonary: Cough [ ] ; Wheezing [ ] ; Hemoptysis [ ] ; Sputum [ ] ; Snoring [ ]     GI: Vomiting [ ] ; Dysphagia [ ] ; Melena [ ] ; Hematochezia [ ] ; Heartburn [ ] ; Abdominal pain [ ] ; Constipation [ ] ; Diarrhea [ ] ; BRBPR [ ]     GU: Hematuria [ ] ; Dysuria [ ] ; Nocturia [ ]   Vascular: Pain in legs with  walking [ ] ; Pain in feet with lying flat [ ] ; Non-healing sores [ ] ; Stroke [ ] ; TIA [ ] ; Slurred speech [ ] ;    Neuro: Headaches [ ] ; Vertigo [ ] ; Seizures [ ] ; Paresthesias [ ] ;Blurred vision [ ] ; Diplopia [ ] ; Vision changes [ ]     Ortho/Skin: Arthritis [ ] ; Joint pain [ ] ; Muscle pain [ ] ; Joint swelling [ ] ; Back Pain [ ] ; Rash [ ]     Psych: Depression [ ] ; Anxiety [ ]     Heme: Bleeding problems [ ] ; Clotting  disorders [ ] ; Anemia [ ]     Endocrine: Diabetes [ ] ; Thyroid dysfunction [ ]    Physical Exam/Data:   Vitals:   11/12/20 2041 11/12/20 2103 11/12/20 2210 11/12/20 2254  BP: (!) 171/105  (!) 128/96 (!) 147/87  Pulse: 84  82 65  Resp: 20  18 14   Temp: 98.3 F (36.8 C)  98.4 F (36.9 C)   TempSrc: Oral  Oral   SpO2: 93%  98% 96%  Weight: 97.5 kg 104 kg  103.9 kg  Height: 5\' 10"  (1.778 m) 5\' 10"  (1.778 m)  5\' 10"  (1.778 m)   No intake or output data in the 24 hours ending 11/12/20 2343 Last 3 Weights 11/12/2020 11/12/2020 11/12/2020  Weight (lbs) 229 lb 229 lb 4.5 oz 215 lb  Weight (kg) 103.874 kg 104 kg 97.523 kg     Body mass index is 32.86 kg/m.  General:  Well nourished, well developed, in no acute distress HEENT: normal Lymph: no adenopathy Neck: no JVD Endocrine:  No thryomegaly Vascular: No carotid bruits; FA pulses 2+ bilaterally without bruits  Cardiac:  normal S1, S2; RRR; no murmur Lungs:  clear to auscultation bilaterally, no wheezing, rhonchi or rales  Abd: soft, nontender, no hepatomegaly  Ext: no LE edema Musculoskeletal:  No deformities, BUE and BLE strength normal and equal Skin: warm and dry  Neuro:  CNs 2-12 intact, no focal abnormalities noted Psych:  Normal affect   EKG:  The ECG that was done shows no ST changes, NSR  Relevant CV Studies: None   Laboratory Data:  High Sensitivity Troponin:   Recent Labs  Lab 11/12/20 2105  TROPONINIHS 25*      Chemistry Recent Labs  Lab 11/12/20 2105  NA 142  K 3.6  CL 105  CO2 24    GLUCOSE 110*  BUN 10  CREATININE 1.10  CALCIUM 9.3  GFRNONAA >60  ANIONGAP 13    No results for input(s): PROT, ALBUMIN, AST, ALT, ALKPHOS, BILITOT in the last 168 hours. Hematology Recent Labs  Lab 11/12/20 2105  WBC 8.3  RBC 5.23  HGB 14.8  HCT 45.9  MCV 87.8  MCH 28.3  MCHC 32.2  RDW 12.7  PLT 236   BNPNo results for input(s): BNP, PROBNP in the last 168 hours.  DDimer No results for input(s): DDIMER in the last 168 hours.  Radiology/Studies:  DG Chest 2 View  Result Date: 11/12/2020 CLINICAL DATA:  Chest pain. EXAM: CHEST - 2 VIEW COMPARISON:  October 23, 2019 FINDINGS: The heart size and mediastinal contours are within normal limits. Both lungs are clear. The visualized skeletal structures are unremarkable. IMPRESSION: No active cardiopulmonary disease. Electronically Signed   By: Katherine Mantlehristopher  Green M.D.   On: 11/12/2020 21:33   HEART score (5) at moderate risk   Assessment and Plan:  55M with HTN and tobacco use who presents with acute onset chest pain/pressure concerning for unstable angina.   1. Unstable angina Mr. Gaye PollackJeddy's symptoms are concerning for ACS and he has no history of prior reflux or other GI symptoms that would clearly explain his presentation.  His troponins were only mildly abnormal and his ECG was normal.  He does have risk factors for coronary disease and does not regularly seek medical care.  Based off my evaluation and discussion with his wife he is not often complaining of medical issues so for him to be in near tearful pain was very abnormal for him.  His A1c return is normal although he  does have hypertension and a significant smoking history.  Plan to treat him as unstable angina currently until further work-up can be obtained.  Did not start P2Y12 yet given that he is chest pain-free and ECG along with troponins were relatively benign.  If he has significant chest pain his current would initiate P2Y12 prior to cath.  He takes no medications listed  on his home med rec.  He did experience significant symptom improvement following sublingual nitroglycerin x2 however still has persistent chest pressure of 3/10 severity after additional evaluation followed nitroglycerin.  Started on low-dose nitroglycerin gtt. for pain control.  Will consider coronary angiography Monday given his story.  Although this could be not anginal equivalent, his story is concerning for ACS.  - s/p ASA 324 mg PO once, start ASA 81 mg PO daily - start lisinopril 5 mg PO daily, uptitrate as tolerated - start atorva 80 mg PO qhs  - start heparin gtt - TTE 12/05  2. HTN sBP 130-170 on admission. Started lisinopril as above - uptitrate lisinopril   Severity of Illness: The appropriate patient status for this patient is INPATIENT. Inpatient status is judged to be reasonable and necessary in order to provide the required intensity of service to ensure the patient's safety. The patient's presenting symptoms, physical exam findings, and initial radiographic and laboratory data in the context of their chronic comorbidities is felt to place them at high risk for further clinical deterioration. Furthermore, it is not anticipated that the patient will be medically stable for discharge from the hospital within 2 midnights of admission. The following factors support the patient status of inpatient.   " The patient's presenting symptoms include chest pain/pressure. " The worrisome physical exam findings include none. " The initial radiographic and laboratory data are worrisome because of elevated troponin. " The chronic co-morbidities include HTN, tobacco use.  * I certify that at the point of admission it is my clinical judgment that the patient will require inpatient hospital care spanning beyond 2 midnights from the point of admission due to high intensity of service, high risk for further deterioration and high frequency of surveillance required.*   For questions or updates,  please contact [facility] Please consult [email or web address] for contact info under    Signed, Linton Rump, MD  11/12/2020 11:43 PM

## 2020-11-12 NOTE — ED Provider Notes (Signed)
[facility] EMERGENCY DEPARTMENT Provider Note   CSN: [id number] Arrival date & time: 11/12/20  2031     History Chief Complaint  Patient presents with  . Chest Pain    Dylan Holmes is a 54 y.o. male.  The history is provided by the patient and medical records.  Chest Pain   54 y.o. M with hx of enlarged heart, presenting to the ED for chest pain.  Patient states this began this evening while he was sitting in his recliner watching TV.  States feels like a squeezing sensation in the left side of his chest.  At initial onset of pain he did have some tingling down the left arm, pain into his neck, nausea, and vomited x1.  He initially thought this was due to indigestion so wife gave him some over-the-counter medications but he vomited this up.  States he has never had problems like this that he can recall.  Did have a reported stress test many years ago that was normal.  Mother does have history of heart disease, still living.  He is a daily smoker.  Does not currently have a primary care doctor, no recent physical.  Patient also complains of rash beneath his axilla after using a new deodorant few days ago.  States he was seen in a separate ED a few days ago and prescribed triamcinolone but feels like symptoms are worsening.  Is now had some weeping from area in the right axilla.  He denies any fever.  States now he has an area of "raw skin" that is painful to the touch.  History reviewed. No pertinent past medical history.  There are no problems to display for this patient.   Past Surgical History:  Procedure Laterality Date  . APPENDECTOMY         No family history on file.  Social History   Tobacco Use  . Smoking status: Current Every Day Smoker    Packs/day: 1.00  . Smokeless tobacco: Never Used  Substance Use Topics  . Alcohol use: Yes  . Drug use: Yes    Types: Marijuana    Home Medications Prior to Admission medications   Medication Sig  Start Date End Date Taking? Authorizing Provider  amoxicillin-clavulanate (AUGMENTIN) 875-125 MG tablet Take 1 tablet by mouth every 12 (twelve) hours. 02/11/20   Kendrick, Caitlyn S, PA-C  aspirin EC 81 MG tablet Take 81 mg by mouth daily.    [provider]  benzonatate (TESSALON) 200 MG capsule Take 1 capsule (200 mg total) by mouth 3 (three) times daily as needed for cough. 04/22/18   Caccavale, Sophia, PA-C  cyclobenzaprine (FLEXERIL) 10 MG tablet Take 1 tablet (10 mg total) by mouth 2 (two) times daily as needed for muscle spasms. Patient not taking: Reported on 04/22/2018 01/21/18   Dietrich Pates, PA-C  fluticasone (FLONASE) 50 MCG/ACT nasal spray Place 2 sprays into both nostrils daily. Patient not taking: Reported on 04/22/2018 09/30/15   Muthersbaugh, Dahlia Client, PA-C  naproxen (NAPROSYN) 500 MG tablet Take 1 tablet (500 mg total) by mouth 2 (two) times daily. Patient not taking: Reported on 04/22/2018 01/21/18   Dietrich Pates, PA-C  ondansetron (ZOFRAN) 4 MG tablet Take 1 tablet (4 mg total) by mouth every 8 (eight) hours as needed for nausea or vomiting. 04/22/18   Caccavale, Sophia, PA-C  predniSONE (DELTASONE) 10 MG tablet Take 2 tablets (20 mg total) by mouth 2 (two) times daily with a meal. Patient not taking: Reported on 04/22/2018  03/26/17   Janne Napoleon, NP  PRESCRIPTION MEDICATION Apply 1 application topically daily as needed (Used for burn on his toe).     [provider]  promethazine-dextromethorphan (PROMETHAZINE-DM) 6.25-15 MG/5ML syrup Take 5 mLs by mouth 4 (four) times daily as needed. 04/22/18   Caccavale, Sophia, PA-C    Allergies    Patient has no known allergies.  Review of Systems   Review of Systems  Cardiovascular: Positive for chest pain.  Skin: Positive for rash.  All other systems reviewed and are negative.   Physical Exam Updated Vital Signs BP (!) 147/87 (BP Location: Right Arm)   Pulse 65   Temp 98.4 F (36.9 C) (Oral)   Resp 14   Ht 5\' 10"   (1.778 m)   Wt 103.9 kg   SpO2 96%   BMI 32.86 kg/m   Physical Exam Vitals and nursing note reviewed.  Constitutional:      Appearance: He is well-developed.  HENT:     Head: Normocephalic and atraumatic.  Eyes:     Conjunctiva/sclera: Conjunctivae normal.     Pupils: Pupils are equal, round, and reactive to light.  Cardiovascular:     Rate and Rhythm: Normal rate and regular rhythm.     Heart sounds: Normal heart sounds.  Pulmonary:     Effort: Pulmonary effort is normal.     Breath sounds: Normal breath sounds. No wheezing or rhonchi.  Abdominal:     General: Bowel sounds are normal.     Palpations: Abdomen is soft.  Musculoskeletal:        General: Normal range of motion.     Cervical back: Normal range of motion.  Skin:    General: Skin is warm and dry.     Comments: Right axilla with small area of skin avulsed away, mild weeping without purulent drainage, no cellulitic changes Left axilla with more dried skin  Neurological:     Mental Status: He is alert and oriented to person, place, and time.     ED Results / Procedures / Treatments   Labs (all labs ordered are listed, but only abnormal results are displayed) Labs Reviewed  BASIC METABOLIC PANEL - Abnormal; Notable for the following components:      Result Value   Glucose, Bld 110 (*)    All other components within normal limits  TROPONIN I (HIGH SENSITIVITY) - Abnormal; Notable for the following components:   Troponin I (High Sensitivity) 25 (*)    All other components within normal limits  TROPONIN I (HIGH SENSITIVITY) - Abnormal; Notable for the following components:   Troponin I (High Sensitivity) 19 (*)    All other components within normal limits  RESP PANEL BY RT-PCR (FLU A&B, COVID) ARPGX2  CBC  HEPARIN LEVEL (UNFRACTIONATED)    EKG EKG Interpretation  Date/Time:  Saturday November 12 2020 22:52:11 EST Ventricular Rate:  66 PR Interval:    QRS Duration: 76 QT Interval:  407 QTC  Calculation: 427 R Axis:   48 Text Interpretation: Sinus or ectopic atrial rhythm No STEMI Confirmed by [id number] [phone]) on 11/12/2020 10:57:44 PM   Radiology DG Chest 2 View  Result Date: 11/12/2020 CLINICAL DATA:  Chest pain. EXAM: CHEST - 2 VIEW COMPARISON:  October 23, 2019 FINDINGS: The heart size and mediastinal contours are within normal limits. Both lungs are clear. The visualized skeletal structures are unremarkable. IMPRESSION: No active cardiopulmonary disease. Electronically Signed   By: October 25, 2019 M.D.   On: 11/12/2020 21:33  Procedures Procedures (including critical care time)  CRITICAL CARE Performed by: Garlon Hatchet   Total critical care time: 45 minutes  Critical care time was exclusive of separately billable procedures and treating other patients.  Critical care was necessary to treat or prevent imminent or life-threatening deterioration.  Critical care was time spent personally by me on the following activities: development of treatment plan with patient and/or surrogate as well as nursing, discussions with consultants, evaluation of patient's response to treatment, examination of patient, obtaining history from patient or surrogate, ordering and performing treatments and interventions, ordering and review of laboratory studies, ordering and review of radiographic studies, pulse oximetry and re-evaluation of patient's condition.   Medications Ordered in ED Medications  nitroGLYCERIN (NITROSTAT) SL tablet 0.4 mg (0.4 mg Sublingual Given 11/13/20 0045)  heparin ADULT infusion 100 units/mL (25000 units/240mL sodium chloride 0.45%) (1,250 Units/hr Intravenous New Bag/Given 11/13/20 0032)  acetaminophen (TYLENOL) tablet 650 mg (650 mg Oral Given 11/12/20 2211)  aspirin chewable tablet 324 mg (324 mg Oral Given 11/13/20 0026)  mupirocin cream (BACTROBAN) 2 % ( Topical Given 11/13/20 0040)  heparin bolus via infusion 4,000 Units (4,000 Units Intravenous Bolus  from Bag 11/13/20 0032)    ED Course  I have reviewed the triage vital signs and the nursing notes.  Pertinent labs & imaging results that were available during my care of the patient were reviewed by me and considered in my medical decision making (see chart for details).    MDM Rules/Calculators/A&P  54 year old male presenting to the ED with chest pain that occurred prior to arrival.  Sitting in recliner relaxing and watching TV when this occurred.  Reports associated shortness of breath, tingling of left arm, neck pain, and one episode of vomiting.  EKG here with some subtle T wave changes but no acute ischemia.  Initial troponin is elevated at 25.  Other labs are grossly reassuring.  Chest x-ray is clear.  Patient symptoms somewhat concerning.  Reports OP stress test years ago that was reportedly normal, no other prior cardiac work-up.  He is a smoker, mother with heart disease.  Will give full dose ASA, NTG.  Also has area beneath axilla that appears to be new chemical burn from deodorant.  Some weeping but no gross infection/abscess formation.  Mupirocin applied.  Discussed with on call cardiology, Dr. Early Osmond-- we will start heparin, cardiology to admit.  Final Clinical Impression(s) / ED Diagnoses Final diagnoses:  Chest pain in adult    Rx / DC Orders ED Discharge Orders    None       Garlon Hatchet, PA-C 11/13/20 0056    Marily Memos, MD 11/13/20 [phone]

## 2020-11-12 NOTE — Progress Notes (Signed)
ANTICOAGULATION CONSULT NOTE - Initial Consult  Pharmacy Consult for Heparin Indication: chest pain/ACS  No Known Allergies  Patient Measurements: Height: 5\' 10"  (177.8 cm) Weight: 103.9 kg (229 lb) IBW/kg (Calculated) : 73 Heparin Dosing Weight: 100 kg  Vital Signs: Temp: 98.4 F (36.9 C) (12/04 2210) Temp Source: Oral (12/04 2210) BP: 147/87 (12/04 2254) Pulse Rate: 65 (12/04 2254)  Labs: Recent Labs    11/12/20 2105  HGB 14.8  HCT 45.9  PLT 236  CREATININE 1.10  TROPONINIHS 25*    Estimated Creatinine Clearance: 90.6 mL/min (by C-G formula based on SCr of 1.1 mg/dL).   Medical History: History reviewed. No pertinent past medical history.  Medications:  No current facility-administered medications on file prior to encounter.   Current Outpatient Medications on File Prior to Encounter  Medication Sig Dispense Refill  . amoxicillin-clavulanate (AUGMENTIN) 875-125 MG tablet Take 1 tablet by mouth every 12 (twelve) hours. 14 tablet 0  . aspirin EC 81 MG tablet Take 81 mg by mouth daily.    . benzonatate (TESSALON) 200 MG capsule Take 1 capsule (200 mg total) by mouth 3 (three) times daily as needed for cough. 20 capsule 0  . cyclobenzaprine (FLEXERIL) 10 MG tablet Take 1 tablet (10 mg total) by mouth 2 (two) times daily as needed for muscle spasms. (Patient not taking: Reported on 04/22/2018) 20 tablet 0  . fluticasone (FLONASE) 50 MCG/ACT nasal spray Place 2 sprays into both nostrils daily. (Patient not taking: Reported on 04/22/2018) 9.9 g 2  . naproxen (NAPROSYN) 500 MG tablet Take 1 tablet (500 mg total) by mouth 2 (two) times daily. (Patient not taking: Reported on 04/22/2018) 30 tablet 0  . ondansetron (ZOFRAN) 4 MG tablet Take 1 tablet (4 mg total) by mouth every 8 (eight) hours as needed for nausea or vomiting. 12 tablet 0  . predniSONE (DELTASONE) 10 MG tablet Take 2 tablets (20 mg total) by mouth 2 (two) times daily with a meal. (Patient not taking: Reported on  04/22/2018) 16 tablet 0  . PRESCRIPTION MEDICATION Apply 1 application topically daily as needed (Used for burn on his toe).     . promethazine-dextromethorphan (PROMETHAZINE-DM) 6.25-15 MG/5ML syrup Take 5 mLs by mouth 4 (four) times daily as needed. 118 mL 0     Assessment: 54 y.o. male with chest pain for heparin  Goal of Therapy:  Heparin level 0.3-0.7 units/ml Monitor platelets by anticoagulation protocol: Yes   Plan:  Heparin 4000 units IV bolus, then start heparin 1250 units/hr Check heparin level in 6 hours.   59 11/12/2020,11:35 PM

## 2020-11-12 NOTE — ED Triage Notes (Signed)
Patient reports left chest pain with SOB and emesis this evening , no diaphoresis or cough .

## 2020-11-13 ENCOUNTER — Inpatient Hospital Stay (HOSPITAL_COMMUNITY): Payer: BLUE CROSS/BLUE SHIELD

## 2020-11-13 DIAGNOSIS — R079 Chest pain, unspecified: Secondary | ICD-10-CM

## 2020-11-13 DIAGNOSIS — I10 Essential (primary) hypertension: Secondary | ICD-10-CM | POA: Diagnosis present

## 2020-11-13 DIAGNOSIS — K209 Esophagitis, unspecified without bleeding: Secondary | ICD-10-CM | POA: Diagnosis not present

## 2020-11-13 DIAGNOSIS — I200 Unstable angina: Secondary | ICD-10-CM | POA: Diagnosis present

## 2020-11-13 DIAGNOSIS — I214 Non-ST elevation (NSTEMI) myocardial infarction: Secondary | ICD-10-CM | POA: Diagnosis present

## 2020-11-13 LAB — LIPID PANEL
Cholesterol: 131 mg/dL (ref 0–200)
HDL: 49 mg/dL (ref >40)
LDL Cholesterol: 66 mg/dL (ref 0–99)
Total CHOL/HDL Ratio: 2.7 RATIO
Triglycerides: 79 mg/dL (ref <150)
VLDL: 16 mg/dL (ref 0–40)

## 2020-11-13 LAB — HEMOGLOBIN A1C
Hgb A1c MFr Bld: 5.4 % (ref 4.8–5.6)
Mean Plasma Glucose: 108.28 mg/dL

## 2020-11-13 LAB — HEPARIN LEVEL (UNFRACTIONATED)
Heparin Unfractionated: 0.15 IU/mL — ABNORMAL LOW (ref 0.30–0.70)
Heparin Unfractionated: 0.27 IU/mL — ABNORMAL LOW (ref 0.30–0.70)

## 2020-11-13 LAB — RESP PANEL BY RT-PCR (FLU A&B, COVID) ARPGX2
Influenza A by PCR: NEGATIVE
Influenza B by PCR: NEGATIVE
SARS Coronavirus 2 by RT PCR: NEGATIVE

## 2020-11-13 LAB — TSH: TSH: 1.859 u[IU]/mL (ref 0.350–4.500)

## 2020-11-13 MED ORDER — IOHEXOL 350 MG/ML SOLN
80.0000 mL | Freq: Once | INTRAVENOUS | Status: AC | PRN
  Administered 2020-11-13: 80 mL via INTRAVENOUS

## 2020-11-13 MED ORDER — METOPROLOL TARTRATE 50 MG PO TABS
50.0000 mg | ORAL_TABLET | Freq: Once | ORAL | Status: AC
  Administered 2020-11-13: 50 mg via ORAL
  Filled 2020-11-13: qty 1

## 2020-11-13 MED ORDER — LISINOPRIL 5 MG PO TABS: 5.0000 mg | ORAL_TABLET | Freq: Every day | ORAL | Status: DC

## 2020-11-13 MED ORDER — ATORVASTATIN CALCIUM 80 MG PO TABS: 80.0000 mg | ORAL_TABLET | Freq: Every day | ORAL | Status: DC

## 2020-11-13 MED ORDER — ATORVASTATIN CALCIUM 80 MG PO TABS
80.0000 mg | ORAL_TABLET | Freq: Every day | ORAL | Status: DC
  Administered 2020-11-13: 80 mg via ORAL
  Filled 2020-11-13 (×2): qty 1

## 2020-11-13 MED ORDER — POTASSIUM CHLORIDE CRYS ER 20 MEQ PO TBCR
40.0000 meq | EXTENDED_RELEASE_TABLET | Freq: Once | ORAL | Status: AC
  Administered 2020-11-13: 40 meq via ORAL
  Filled 2020-11-13: qty 2

## 2020-11-13 MED ORDER — ZOLPIDEM TARTRATE 5 MG PO TABS: 5.0000 mg | ORAL_TABLET | Freq: Every evening | ORAL | Status: DC | PRN

## 2020-11-13 MED ORDER — ONDANSETRON HCL 4 MG/2ML IJ SOLN: 4.0000 mg | Freq: Four times a day (QID) | INTRAMUSCULAR | Status: DC | PRN

## 2020-11-13 MED ORDER — NITROGLYCERIN IN D5W 200-5 MCG/ML-% IV SOLN
0.0000 ug/min | INTRAVENOUS | Status: DC
  Administered 2020-11-13: 20 ug/min via INTRAVENOUS
  Filled 2020-11-13: qty 250

## 2020-11-13 MED ORDER — ACETAMINOPHEN 325 MG PO TABS
650.0000 mg | ORAL_TABLET | ORAL | Status: DC | PRN
  Administered 2020-11-13: 650 mg via ORAL
  Filled 2020-11-13: qty 2

## 2020-11-13 MED ORDER — NITROGLYCERIN 0.4 MG SL SUBL
0.8000 mg | SUBLINGUAL_TABLET | Freq: Once | SUBLINGUAL | Status: AC
  Administered 2020-11-13: 0.8 mg via SUBLINGUAL

## 2020-11-13 MED ORDER — PANTOPRAZOLE SODIUM 40 MG PO TBEC: 40.0000 mg | DELAYED_RELEASE_TABLET | Freq: Every day | ORAL | Status: DC

## 2020-11-13 MED ORDER — ALPRAZOLAM 0.25 MG PO TABS: 0.2500 mg | ORAL_TABLET | Freq: Two times a day (BID) | ORAL | Status: DC | PRN

## 2020-11-13 MED ORDER — LISINOPRIL 5 MG PO TABS
5.0000 mg | ORAL_TABLET | Freq: Every day | ORAL | Status: DC
  Administered 2020-11-13: 5 mg via ORAL
  Filled 2020-11-13 (×2): qty 1

## 2020-11-13 MED ORDER — NITROGLYCERIN 0.4 MG SL SUBL: 0.4000 mg | SUBLINGUAL_TABLET | SUBLINGUAL | Status: DC | PRN

## 2020-11-13 MED ORDER — OMEPRAZOLE MAGNESIUM 20 MG PO TBEC: 20.0000 mg | DELAYED_RELEASE_TABLET | Freq: Every day | ORAL | 0 refills | Status: DC

## 2020-11-13 MED ORDER — ASPIRIN 81 MG PO CHEW
81.0000 mg | CHEWABLE_TABLET | Freq: Every day | ORAL | Status: DC
  Administered 2020-11-13: 81 mg via ORAL
  Filled 2020-11-13: qty 1

## 2020-11-13 NOTE — Progress Notes (Signed)
ANTICOAGULATION CONSULT NOTE - Follow-Up Consult  Pharmacy Consult for Heparin Indication: chest pain/ACS  No Known Allergies  Patient Measurements: Height: 5\' 10"  (177.8 cm) Weight: 99.7 kg (219 lb 11.2 oz) (scale b) IBW/kg (Calculated) : 73 Heparin Dosing Weight: 100 kg  Vital Signs: Temp: 98 F (36.7 C) (12/05 0653) Temp Source: Oral (12/05 0653) BP: 132/78 (12/05 0900) Pulse Rate: 67 (12/05 0900)  Labs: Recent Labs    11/12/20 2105 11/12/20 2301 11/13/20 0753  HGB 14.8  --   --   HCT 45.9  --   --   PLT 236  --   --   HEPARINUNFRC  --   --  0.15*  CREATININE 1.10  --   --   TROPONINIHS 25* 19*  --     Estimated Creatinine Clearance: 88.8 mL/min (by C-G formula based on SCr of 1.1 mg/dL).   Medical History: History reviewed. No pertinent past medical history.  Medications:  No current facility-administered medications on file prior to encounter.   Current Outpatient Medications on File Prior to Encounter  Medication Sig Dispense Refill  . amoxicillin-clavulanate (AUGMENTIN) 875-125 MG tablet Take 1 tablet by mouth every 12 (twelve) hours. 14 tablet 0  . aspirin EC 81 MG tablet Take 81 mg by mouth daily.    . benzonatate (TESSALON) 200 MG capsule Take 1 capsule (200 mg total) by mouth 3 (three) times daily as needed for cough. 20 capsule 0  . cyclobenzaprine (FLEXERIL) 10 MG tablet Take 1 tablet (10 mg total) by mouth 2 (two) times daily as needed for muscle spasms. (Patient not taking: Reported on 04/22/2018) 20 tablet 0  . fluticasone (FLONASE) 50 MCG/ACT nasal spray Place 2 sprays into both nostrils daily. (Patient not taking: Reported on 04/22/2018) 9.9 g 2  . naproxen (NAPROSYN) 500 MG tablet Take 1 tablet (500 mg total) by mouth 2 (two) times daily. (Patient not taking: Reported on 04/22/2018) 30 tablet 0  . ondansetron (ZOFRAN) 4 MG tablet Take 1 tablet (4 mg total) by mouth every 8 (eight) hours as needed for nausea or vomiting. 12 tablet 0  . predniSONE  (DELTASONE) 10 MG tablet Take 2 tablets (20 mg total) by mouth 2 (two) times daily with a meal. (Patient not taking: Reported on 04/22/2018) 16 tablet 0  . PRESCRIPTION MEDICATION Apply 1 application topically daily as needed (Used for burn on his toe).     . promethazine-dextromethorphan (PROMETHAZINE-DM) 6.25-15 MG/5ML syrup Take 5 mLs by mouth 4 (four) times daily as needed. 118 mL 0     Assessment: 54 y.o. male admitted with chest pain for heparin.  Heparin level subtherapeutic on 1250 units/hr.  No bleeding noted.   Goal of Therapy:  Heparin level 0.3-0.7 units/ml Monitor platelets by anticoagulation protocol: Yes   Plan:  Increase heparin 1500 units/hr Check heparin level in 6 hours.  Daily heparin level and CBC while on heparin.  57, Pharm.D., BCPS Clinical Pharmacist  **Pharmacist phone directory can be found on [email or web address] listed under [facility].  11/13/2020 9:34 AM

## 2020-11-13 NOTE — Discharge Summary (Signed)
Discharge Summary    Patient ID: Dylan Holmes MRN: [id number]; DOB: Dec 04, 1966  Admit date: 11/12/2020 Discharge date: 11/13/2020  Primary Care Provider: Pcp, No  Primary Cardiologist: No primary care provider on file.  Primary Electrophysiologist:  None   Discharge Diagnoses    Principal Problem:   Unstable angina (HCC) Active Problems:   NSTEMI (non-ST elevated myocardial infarction) [facility])   Essential hypertension    Diagnostic Studies/Procedures    CTA Coronary:  IMPRESSION: 1. Mild distal esophageal thickening and faint adjacent paraesophageal stranding, correlate for symptoms of esophagitis. No CT evidence of perforation or abscess. 2. Fatty stippling in the anterior mediastinum, nonspecific but most often reflective of a thymic remnant. 3. Cardiac findings as per cardiology interpretation.  IMPRESSION: 1. Coronary calcium score of 0.  2. Normal coronary origin with right dominance.  3. No evidence of CAD  4. Step artifact due to respiratory motion during study precludes interpretation of a portion of mid and distal LAD, mid RCA, and proximal LCX. However, no CAD is seen.  CAD-RADS 0. No evidence of CAD (0%). Consider non-atherosclerotic causes of chest pain. _____________   History of Present Illness     Dylan Holmes is a 54 y.o. male with with HTN and tobacco use who presents with acute onset chest pain/pressure.  Hospital Course     Dylan Holmes presented on 12/4 after acute onset of severe chest pain. Describes a squeezing sensation with radiation down his left arm. States that he took a medication for indigestion and afterwards had episode of emesis. Reports chest pain was 9 out of 10 in intensity, prompting him to go to the ED. Reported pain improved in the ED after sublingual nitroglycerin but continued to have 3 out of 10 pain. He was initially started on heparin drip and nitroglycerin drip. EKG showed nonspecific T wave abnormality.  High-sensitivity troponin 25 > 19. Given his chest pain persisting for hours and unremarkable troponins, suspected noncardiac pain. Coronary CTA was done on 12/5, which showed no evidence of coronary artery disease, though respiratory motion artifact was present. Esophageal thickening and faint paraesophageal stranding was noted, suggesting possible esophagitis. Suspect esophagitis as a cause of patient's chest pain. Started on PPI and recommend PCP follow-up. Will also schedule cardiology follow-up, as echocardiogram was planned but could not be done prior to discharge. Will plan echocardiogram as an outpatient to rule out structural heart disease and cardiology follow-up.  Did the patient have an acute coronary syndrome (MI, NSTEMI, STEMI, etc) this admission?:  No                               Did the patient have a percutaneous coronary intervention (stent / angioplasty)?:  No.       _____________  Discharge Vitals Blood pressure 115/74, pulse 60, temperature 97.8 F (36.6 C), temperature source Oral, resp. rate 20, height 5\' 10"  (1.778 m), weight 99.7 kg, SpO2 96 %.  Filed Weights   11/12/20 2103 11/12/20 2254 11/13/20 0300  Weight: 104 kg 103.9 kg 99.7 kg    Labs & Radiologic Studies    CBC Recent Labs    11/12/20 2105  WBC 8.3  HGB 14.8  HCT 45.9  MCV 87.8  PLT 236   Basic Metabolic Panel Recent Labs    2106 2105  NA 142  K 3.6  CL 105  CO2 24  GLUCOSE 110*  BUN 10  CREATININE 1.10  CALCIUM 9.3  Liver Function Tests No results for input(s): AST, ALT, ALKPHOS, BILITOT, PROT, ALBUMIN in the last 72 hours. No results for input(s): LIPASE, AMYLASE in the last 72 hours. High Sensitivity Troponin:   Recent Labs  Lab 11/12/20 2105 11/12/20 2301  TROPONINIHS 25* 19*    BNP Invalid input(s): POCBNP D-Dimer No results for input(s): DDIMER in the last 72 hours. Hemoglobin A1C Recent Labs    11/13/20 0428  HGBA1C 5.4   Fasting Lipid Panel Recent Labs     11/13/20 0428  CHOL 131  HDL 49  LDLCALC 66  TRIG 79  CHOLHDL 2.7   Thyroid Function Tests Recent Labs    11/13/20 0428  TSH 1.859   _____________  DG Chest 2 View  Result Date: 11/12/2020 CLINICAL DATA:  Chest pain. EXAM: CHEST - 2 VIEW COMPARISON:  October 23, 2019 FINDINGS: The heart size and mediastinal contours are within normal limits. Both lungs are clear. The visualized skeletal structures are unremarkable. IMPRESSION: No active cardiopulmonary disease. Electronically Signed   By: Katherine Mantle M.D.   On: 11/12/2020 21:33   CT CORONARY MORPH W/CTA COR W/SCORE W/CA W/CM &/OR WO/CM  Addendum Date: 11/13/2020   ADDENDUM REPORT: 11/13/2020 19:05 CLINICAL DATA:  The following report is an over-read performed by radiologist Dr. Wardell Heath [facility] on 11/13/2020. This over-read does not include interpretation of cardiac or coronary anatomy or pathology. The CTA interpretation by the cardiologist is attached. COMPARISON:  Radiograph 11/12/2020, CT abdomen pelvis 02/11/2020 FINDINGS: Cardiac findings as per cardiology interpretation. Small amount calcified noncalcified eccentric mural plaque is present in the visible portion of the ascending thoracic aorta without hyperdensity on the noncontrast sequence, plaque displacement or other concerning features to suggest intramural hematoma. No adjacent periaortic stranding. Central pulmonary arteries are normal caliber. No large central filling defects with more distal evaluation limited by a non tailored technique and motion artifact. No major venous abnormalities within the limitations of this exam. Fatty stippling in the anterior mediastinum, nonspecific but most often reflective of a thymic remnant. Small volume of fluid in the pericardial recesses within physiologic normal no free mediastinal fluid or gas is seen. No worrisome mediastinal or hilar lymph nodes within the margins of imaging. There is some mild  distal esophageal thickening and faint adjacent paraesophageal stranding. No extraluminal gas, collection or abscess is seen. Included portions of the trachea are unremarkable. No consolidation, features of edema, pneumothorax, or effusion. Atelectatic changes dependently. Respiratory motion. No suspicious pulmonary nodules or masses. No acute or worrisome osseous abnormalities. Mild bilateral gynecomastia. No worrisome chest wall findings. IMPRESSION: 1. Mild distal esophageal thickening and faint adjacent paraesophageal stranding, correlate for symptoms of esophagitis. No CT evidence of perforation or abscess. 2. Fatty stippling in the anterior mediastinum, nonspecific but most often reflective of a thymic remnant. 3. Cardiac findings as per cardiology interpretation. These results were discussed by telephone at the time of interpretation on 11/13/2020 at 7:00 pm to provider [facility] , who verbally acknowledged these results. Electronically Signed   By: Kreg Shropshire M.D.   On: 11/13/2020 19:05   Result Date: 11/13/2020 CLINICAL DATA:  17M with chest pain EXAM: Cardiac/Coronary CTA TECHNIQUE: The patient was scanned on a [employer]. FINDINGS: A 100 kV prospective scan was triggered in the descending thoracic aorta at 111 HU's. Axial non-contrast 3 mm slices were carried out through the heart. The data set was analyzed on a dedicated work station and scored using the Agatson method. Gantry  rotation speed was 250 msecs and collimation was .6 mm. 0.8 mg of sl NTG was given. The 3D data set was reconstructed in 5% intervals of the R-R cycle. Phases were analyzed on a dedicated work station using MPR, MIP and VRT modes. The patient received 80 cc of contrast. Coronary Arteries:  Normal coronary origin.  Right dominance. RCA is a large dominant artery that gives rise to PDA and PLA. There is no plaque. Left main is a large artery that gives rise to LAD and LCX arteries. LAD is a large vessel that  has no plaque. LCX is a non-dominant artery that gives rise to one large OM1 branch. There is no plaque. Other findings: Left Ventricle: Normal size Left Atrium: Mild enlargement Pulmonary Veins: Normal configuration Right Ventricle: Mild dilatation Right Atrium: Mild enlargement Thoracic aorta: Normal size Pulmonary Arteries: Normal size Systemic Veins: Normal drainage IMPRESSION: 1. Coronary calcium score of 0. 2. Normal coronary origin with right dominance. 3. No evidence of CAD 4. Step artifact due to respiratory motion during study precludes interpretation of a portion of mid and distal LAD, mid RCA, and proximal LCX. However, no CAD is seen. CAD-RADS 0. No evidence of CAD (0%). Consider non-atherosclerotic causes of chest pain. Electronically Signed: By: Epifanio Lesches MD On: 11/13/2020 18:51   Disposition   Pt is being discharged home today in good condition.  Follow-up Plans & Appointments     Discharge Instructions    Diet - low sodium heart healthy   Complete by: As directed    Increase activity slowly   Complete by: As directed       Discharge Medications   Allergies as of 11/13/2020   No Known Allergies     Medication List    STOP taking these medications   benzonatate 200 MG capsule Commonly known as: TESSALON   cyclobenzaprine 10 MG tablet Commonly known as: FLEXERIL   fluticasone 50 MCG/ACT nasal spray Commonly known as: FLONASE   naproxen 500 MG tablet Commonly known as: NAPROSYN   ondansetron 4 MG tablet Commonly known as: ZOFRAN   predniSONE 10 MG tablet Commonly known as: DELTASONE   promethazine-dextromethorphan 6.25-15 MG/5ML syrup Commonly known as: PROMETHAZINE-DM     TAKE these medications   CENTRUM MEN PO Take 1 tablet by mouth daily.   omeprazole 20 MG tablet Commonly known as: PRILOSEC OTC Take 1 tablet (20 mg total) by mouth daily.          Outstanding Labs/Studies     Duration of Discharge Encounter   Greater than  30 minutes including physician time.  Signed, Little Ishikawa, MD 11/13/2020, 10:22 PM

## 2020-11-13 NOTE — Progress Notes (Signed)
ANTICOAGULATION CONSULT NOTE - Follow-Up Consult  Pharmacy Consult for Heparin Indication: chest pain/ACS  No Known Allergies  Patient Measurements: Height: 5\' 10"  (177.8 cm) Weight: 99.7 kg (219 lb 11.2 oz) (scale b) IBW/kg (Calculated) : 73 Heparin Dosing Weight: 100 kg  Vital Signs: Temp: 97.8 F (36.6 C) (12/05 1300) Temp Source: Oral (12/05 1300) BP: 115/74 (12/05 1300) Pulse Rate: 60 (12/05 1300)  Labs: Recent Labs    11/12/20 2105 11/12/20 2301 11/13/20 0753 11/13/20 1619  HGB 14.8  --   --   --   HCT 45.9  --   --   --   PLT 236  --   --   --   HEPARINUNFRC  --   --  0.15* 0.27*  CREATININE 1.10  --   --   --   TROPONINIHS 25* 19*  --   --     Estimated Creatinine Clearance: 88.8 mL/min (by C-G formula based on SCr of 1.1 mg/dL).   Medical History: History reviewed. No pertinent past medical history.  Medications:  No current facility-administered medications on file prior to encounter.   Current Outpatient Medications on File Prior to Encounter  Medication Sig Dispense Refill  . Multiple Vitamins-Minerals (CENTRUM MEN PO) Take 1 tablet by mouth daily.    . benzonatate (TESSALON) 200 MG capsule Take 1 capsule (200 mg total) by mouth 3 (three) times daily as needed for cough. (Patient not taking: Reported on 11/13/2020) 20 capsule 0  . cyclobenzaprine (FLEXERIL) 10 MG tablet Take 1 tablet (10 mg total) by mouth 2 (two) times daily as needed for muscle spasms. (Patient not taking: Reported on 04/22/2018) 20 tablet 0  . fluticasone (FLONASE) 50 MCG/ACT nasal spray Place 2 sprays into both nostrils daily. (Patient not taking: Reported on 04/22/2018) 9.9 g 2  . naproxen (NAPROSYN) 500 MG tablet Take 1 tablet (500 mg total) by mouth 2 (two) times daily. (Patient not taking: Reported on 04/22/2018) 30 tablet 0  . ondansetron (ZOFRAN) 4 MG tablet Take 1 tablet (4 mg total) by mouth every 8 (eight) hours as needed for nausea or vomiting. (Patient not taking: Reported on  11/13/2020) 12 tablet 0  . predniSONE (DELTASONE) 10 MG tablet Take 2 tablets (20 mg total) by mouth 2 (two) times daily with a meal. (Patient not taking: Reported on 04/22/2018) 16 tablet 0  . promethazine-dextromethorphan (PROMETHAZINE-DM) 6.25-15 MG/5ML syrup Take 5 mLs by mouth 4 (four) times daily as needed. (Patient not taking: Reported on 11/13/2020) 118 mL 0  . [DISCONTINUED] amoxicillin-clavulanate (AUGMENTIN) 875-125 MG tablet Take 1 tablet by mouth every 12 (twelve) hours. 14 tablet 0  . [DISCONTINUED] aspirin EC 81 MG tablet Take 81 mg by mouth daily.    . [DISCONTINUED] PRESCRIPTION MEDICATION Apply 1 application topically daily as needed (Used for burn on his toe).        Assessment: 54 y.o. male admitted with chest pain for heparin. Pharmacy consulted for heparin dosing.   Heparin level this afternoon remains SUBtherapeutic though trended up (HL 0.27 << 0.15, goal of 0.3-0.7)  Goal of Therapy:  Heparin level 0.3-0.7 units/ml Monitor platelets by anticoagulation protocol: Yes   Plan:  - Increase Heparin to 1700 units/hr - Will continue to monitor for any signs/symptoms of bleeding and will follow up with heparin level in 6 hours   Thank you for allowing pharmacy to be a part of this patient's care.  57, PharmD, BCPS Clinical Pharmacist Clinical phone for 11/13/2020: 14/04/2020 11/13/2020 5:02 PM   **  Pharmacist phone directory can now be found on [email or web address] ([email or web address]).  Listed under [facility].

## 2020-11-13 NOTE — ED Notes (Signed)
Attempted to call report to 3E. They said they would call back.

## 2020-11-13 NOTE — Progress Notes (Signed)
Progress Note  Patient Name: Dylan Holmes Date of Encounter: 11/13/2020  [facility] Cardiologist: No primary care provider on file.   Subjective   BP 132/78, pulse 67, SPO2 97% on room air.  Continues to have mild chest pain  Inpatient Medications    Scheduled Meds: . aspirin  81 mg Oral Daily  . atorvastatin  80 mg Oral Daily  . lisinopril  5 mg Oral Daily   Continuous Infusions: . heparin 1,500 Units/hr (11/13/20 1026)  . nitroGLYCERIN 20 mcg/min (11/13/20 0427)   PRN Meds: acetaminophen, ALPRAZolam, nitroGLYCERIN, ondansetron (ZOFRAN) IV, zolpidem   Vital Signs    Vitals:   11/13/20 0422 11/13/20 0653 11/13/20 0900 11/13/20 1000  BP: 129/62  132/78   Pulse: 61  67   Resp: 18  14   Temp: 98.5 F (36.9 C) 98 F (36.7 C)    TempSrc: Oral Oral    SpO2: 98%  97% 97%  Weight:      Height:        Intake/Output Summary (Last 24 hours) at 11/13/2020 1141 Last data filed at 11/13/2020 1028 Gross per 24 hour  Intake 559.55 ml  Output 900 ml  Net -340.45 ml   Last 3 Weights 11/13/2020 11/12/2020 11/12/2020  Weight (lbs) 219 lb 11.2 oz 229 lb 229 lb 4.5 oz  Weight (kg) 99.655 kg 103.874 kg 104 kg      Telemetry    NSR 60-70s - Personally Reviewed  ECG    No new ECG - Personally Reviewed  Physical Exam   GEN: No acute distress.   Neck: No JVD Cardiac: RRR, no murmurs, rubs, or gallops.  Respiratory: Clear to auscultation bilaterally. GI: Soft, nontender, non-distended  MS: No edema; No deformity. Neuro:  Nonfocal  Psych: Normal affect   Labs    High Sensitivity Troponin:   Recent Labs  Lab 11/12/20 2105 11/12/20 2301  TROPONINIHS 25* 19*      Chemistry Recent Labs  Lab 11/12/20 2105  NA 142  K 3.6  CL 105  CO2 24  GLUCOSE 110*  BUN 10  CREATININE 1.10  CALCIUM 9.3  GFRNONAA >60  ANIONGAP 13     Hematology Recent Labs  Lab 11/12/20 2105  WBC 8.3  RBC 5.23  HGB 14.8  HCT 45.9  MCV 87.8  MCH 28.3  MCHC 32.2  RDW 12.7    PLT 236    BNPNo results for input(s): BNP, PROBNP in the last 168 hours.   DDimer No results for input(s): DDIMER in the last 168 hours.   Radiology    DG Chest 2 View  Result Date: 11/12/2020 CLINICAL DATA:  Chest pain. EXAM: CHEST - 2 VIEW COMPARISON:  October 23, 2019 FINDINGS: The heart size and mediastinal contours are within normal limits. Both lungs are clear. The visualized skeletal structures are unremarkable. IMPRESSION: No active cardiopulmonary disease. Electronically Signed   By: Katherine Mantle M.D.   On: 11/12/2020 21:33    Cardiac Studies     Patient Profile     54 y.o. male with HTN and tobacco use who presents with acute onset chest pain/pressure.  Assessment & Plan    Chest pain: Reported acute onset of severe chest pain on 12/4.  Described as feeling like elephant sitting on chest, 9 out of 10 in intensity.  Improved with nitroglycerin in the ED.  EKG with T wave inversion in V6.  High-sensitivity troponin 25 > 19 -Given chest pain which has persisted for hours with  unremarkable troponins that were downtrending (25 > 19), less concern for obstructive CAD.  Will evaluate further with coronary CTA.  Will give metoprolol 50 mg prior to study -Echocardiogram -Continue aspirin, statin -Started on IV heparin, can continue for now while awaiting results of coronary CTA.  Will wean off IV nitroglycerin, as will received SL NTG during CTA  For questions or updates, please contact [facility] Please consult [email or web address] for contact info under        Signed, Little Ishikawa, MD  11/13/2020, 11:41 AM

## 2020-12-12 NOTE — Progress Notes (Signed)
Cardiology Office Note:    Date:  12/16/2020   ID:  Dylan Holmes, DOB 01-01-1966, MRN [id number]  PCP:  Pcp, No  Cardiologist:  No primary care provider on file.  Electrophysiologist:  None   Referring MD: No ref. provider found   Chief Complaint  Patient presents with  . Chest Pain    History of Present Illness:    Dylan Holmes is a 55 y.o. male with a hx of hypertension, tobacco use who presents for hospital follow-up.  He was admitted to [facility] from 12/4 through 11/13/2020.  Had presented with chest pains, but troponin elevation not consistent with acute coronary syndrome (25 > 19).  Coronary CTA on 11/13/2020 showed normal coronary arteries, calcium score 0, though did have step artifact due to respiratory motion.  CT was notable for distal esophageal thickening and paraesophageal stranding, suggesting esophagitis.  He was started on PPI for his symptoms.  Echocardiogram was ordered but was not done prior to discharge.  Since discharge from the hospital, he reports he continues to have intermittent chest pain, though has improved.  States that now having left-sided pain that lasts for few seconds and resolves.  Describes as dull aching pain that occurs at rest.  He denies any dyspnea, headedness, syncope, lower extremity edema, or palpitations.  Reports he has not been exercising.  Reports he continues to smoke, about 3 cigarettes/day.   No past medical history on file.  Past Surgical History:  Procedure Laterality Date  . APPENDECTOMY      Current Medications: Current Meds  Medication Sig  . amLODipine (NORVASC) 5 MG tablet Take 1 tablet (5 mg total) by mouth daily.  . Multiple Vitamins-Minerals (CENTRUM MEN PO) Take 1 tablet by mouth daily.  Marland Kitchen omeprazole (PRILOSEC OTC) 20 MG tablet Take 1 tablet (20 mg total) by mouth daily.  . [DISCONTINUED] aspirin EC 81 MG tablet Take 81 mg by mouth daily. Swallow whole.     Allergies:   Patient has no known allergies.   Social History    Socioeconomic History  . Marital status: Single    Spouse name: Not on file  . Number of children: Not on file  . Years of education: Not on file  . Highest education level: Not on file  Occupational History  . Not on file  Tobacco Use  . Smoking status: Current Every Day Smoker    Packs/day: 1.00  . Smokeless tobacco: Never Used  Substance and Sexual Activity  . Alcohol use: Yes  . Drug use: Yes    Types: Marijuana  . Sexual activity: Not on file  Other Topics Concern  . Not on file  Social History Narrative  . Not on file   Social Determinants of Health   Financial Resource Strain: Not on file  Food Insecurity: Not on file  Transportation Needs: Not on file  Physical Activity: Not on file  Stress: Not on file  Social Connections: Not on file     Family History: The patient's family history is not on file.  ROS:   Please see the history of present illness.     All other systems reviewed and are negative.  EKGs/Labs/Other Studies Reviewed:    The following studies were reviewed today:   EKG:  EKG is  ordered today.  The ekg ordered today demonstrates sinus rhythm, rate 58, LVH, nonspecific T wave flattening  Recent Labs: 02/11/2020: ALT 15 11/12/2020: BUN 10; Creatinine, Ser 1.10; Hemoglobin 14.8; Platelets 236; Potassium 3.6; Sodium 142  11/13/2020: TSH 1.859  Recent Lipid Panel    Component Value Date/Time   CHOL 131 11/13/2020 0428   TRIG 79 11/13/2020 0428   HDL 49 11/13/2020 0428   CHOLHDL 2.7 11/13/2020 0428   VLDL 16 11/13/2020 0428   LDLCALC 66 11/13/2020 0428    Physical Exam:    VS:  BP (!) 162/84   Pulse (!) 58   Ht 5' 10.5" (1.791 m)   Wt 225 lb (102.1 kg)   BMI 31.83 kg/m     Wt Readings from Last 3 Encounters:  12/16/20 225 lb (102.1 kg)  11/13/20 219 lb 11.2 oz (99.7 kg)  02/11/20 215 lb (97.5 kg)     GEN:  in no acute distress HEENT: Normal NECK: No JVD; No carotid bruits LYMPHATICS: No lymphadenopathy CARDIAC: RRR, no  murmurs, rubs, gallops RESPIRATORY:  Clear to auscultation without rales, wheezing or rhonchi  ABDOMEN: Soft, non-tender, non-distended MUSCULOSKELETAL:  No edema; No deformity  SKIN: Warm and dry NEUROLOGIC:  Alert and oriented x 3 PSYCHIATRIC:  Normal affect   ASSESSMENT:    1. Chest pain of uncertain etiology   2. Essential hypertension   3. Tobacco use    PLAN:    Chest pain: Normal coronary arteries on CTA, but evidence of esophagitis.  Started on PPI.  Recommend stopping aspirin as may worsen esophagitis and he does not need it as his coronary arteries were normal.  Recommend PCP follow-up.  Will check echocardiogram to rule out structural heart disease.  Hypertension: BP 162/84 in clinic today.  Will start amlodipine 5 mg daily.  Asked patient to check BP daily for next 2 weeks and call with results  Tobacco use: Patient counseled on the risk of tobacco use and cessation strongly encouraged.  Reports he has going to try using nicotine gum to aid in cessation  RTC in 6 months  Medication Adjustments/Labs and Tests Ordered: Current medicines are reviewed at length with the patient today.  Concerns regarding medicines are outlined above.  Orders Placed This Encounter  Procedures  . EKG 12-Lead  . ECHOCARDIOGRAM COMPLETE   Meds ordered this encounter  Medications  . amLODipine (NORVASC) 5 MG tablet    Sig: Take 1 tablet (5 mg total) by mouth daily.    Dispense:  90 tablet    Refill:  3    Patient Instructions  Medication Instructions:  STOP Aspirin START amlodipine 5 mg daily  --recommend getting a blood pressure monitor for home (Omron upper arm cuff)  Please check your blood pressure at home daily, write it down.  Call the office or send message via Mychart with the readings in 2 weeks for Dr. Bjorn Pippin to review.   *If you need a refill on your cardiac medications before your next appointment, please call your pharmacy*  Testing/Procedures: Your physician has  requested that you have an echocardiogram. Echocardiography is a painless test that uses sound waves to create images of your heart. It provides your doctor with information about the size and shape of your heart and how well your heart's chambers and valves are working. This procedure takes approximately one hour. There are no restrictions for this procedure.  This will be done at our [facility] location:  [address]  Follow-Up: At [facility], you and your health needs are our priority.  As part of our continuing mission to provide you with exceptional heart care, we have created designated Provider Care Teams.  These Care Teams  include your primary Cardiologist (physician) and Advanced Practice Providers (APPs -  Physician Assistants and Nurse Practitioners) who all work together to provide you with the care you need, when you need it.  We recommend signing up for the patient portal called "MyChart".  Sign up information is provided on this After Visit Summary.  MyChart is used to connect with patients for Virtual Visits (Telemedicine).  Patients are able to view lab/test results, encounter notes, upcoming appointments, etc.  Non-urgent messages can be sent to your provider as well.   To learn more about what you can do with MyChart, go to [email or web address].    Your next appointment:   6 month(s)  The format for your next appointment:   In Person  Provider:   Oswaldo Milian, MD   Other Instructions Call [phone]) for assistance/list of Primary Care Doctors     Signed, Donato Heinz, MD  12/16/2020 5:45 PM    [place]

## 2020-12-16 ENCOUNTER — Ambulatory Visit (INDEPENDENT_AMBULATORY_CARE_PROVIDER_SITE_OTHER): Payer: BLUE CROSS/BLUE SHIELD | Admitting: Cardiology

## 2020-12-16 ENCOUNTER — Other Ambulatory Visit: Payer: Self-pay

## 2020-12-16 ENCOUNTER — Encounter: Payer: Self-pay | Admitting: Cardiology

## 2020-12-16 VITALS — BP 162/84 | HR 58 | Ht 70.5 in | Wt 225.0 lb

## 2020-12-16 DIAGNOSIS — Z720 Tobacco use: Secondary | ICD-10-CM

## 2020-12-16 DIAGNOSIS — I10 Essential (primary) hypertension: Secondary | ICD-10-CM

## 2020-12-16 DIAGNOSIS — R079 Chest pain, unspecified: Secondary | ICD-10-CM

## 2020-12-16 MED ORDER — AMLODIPINE BESYLATE 5 MG PO TABS: 5.0000 mg | ORAL_TABLET | Freq: Every day | ORAL | 3 refills | Status: DC

## 2020-12-16 NOTE — Patient Instructions (Signed)
Medication Instructions:  STOP Aspirin START amlodipine 5 mg daily  --recommend getting a blood pressure monitor for home (Omron upper arm cuff)  Please check your blood pressure at home daily, write it down.  Call the office or send message via Mychart with the readings in 2 weeks for Dr. Bjorn Pippin to review.   *If you need a refill on your cardiac medications before your next appointment, please call your pharmacy*  Testing/Procedures: Your physician has requested that you have an echocardiogram. Echocardiography is a painless test that uses sound waves to create images of your heart. It provides your doctor with information about the size and shape of your heart and how well your heart's chambers and valves are working. This procedure takes approximately one hour. There are no restrictions for this procedure.  This will be done at our [facility] location:  [address]  Follow-Up: At [facility], you and your health needs are our priority.  As part of our continuing mission to provide you with exceptional heart care, we have created designated Provider Care Teams.  These Care Teams include your primary Cardiologist (physician) and Advanced Practice Providers (APPs -  Physician Assistants and Nurse Practitioners) who all work together to provide you with the care you need, when you need it.  We recommend signing up for the patient portal called "MyChart".  Sign up information is provided on this After Visit Summary.  MyChart is used to connect with patients for Virtual Visits (Telemedicine).  Patients are able to view lab/test results, encounter notes, upcoming appointments, etc.  Non-urgent messages can be sent to your provider as well.   To learn more about what you can do with MyChart, go to [email or web address].    Your next appointment:   6 month(s)  The format for your next appointment:   In Person  Provider:   Epifanio Lesches, MD   Other  Instructions Call ([phone]) for assistance/list of Primary Care Doctors

## 2020-12-19 ENCOUNTER — Telehealth: Payer: Self-pay | Admitting: Cardiology

## 2020-12-19 NOTE — Telephone Encounter (Signed)
Left message to call and schedule Echocardiogram ordered by Dr. Bjorn Pippin

## 2021-01-05 ENCOUNTER — Other Ambulatory Visit (HOSPITAL_COMMUNITY): Payer: Self-pay

## 2021-01-19 ENCOUNTER — Other Ambulatory Visit: Payer: Self-pay

## 2021-01-19 ENCOUNTER — Ambulatory Visit (HOSPITAL_COMMUNITY): Payer: 59 | Attending: Cardiology

## 2021-01-19 DIAGNOSIS — R079 Chest pain, unspecified: Secondary | ICD-10-CM | POA: Insufficient documentation

## 2021-01-19 LAB — ECHOCARDIOGRAM COMPLETE
Area-P 1/2: 3.12 cm2
S' Lateral: 2.5 cm

## 2021-06-29 ENCOUNTER — Ambulatory Visit (HOSPITAL_COMMUNITY)
Admission: EM | Admit: 2021-06-29 | Discharge: 2021-06-29 | Disposition: A | Discharge status: Home / Self Care | Payer: 59 | Attending: Family Medicine | Admitting: Family Medicine

## 2021-06-29 ENCOUNTER — Encounter (HOSPITAL_COMMUNITY): Payer: Self-pay

## 2021-06-29 ENCOUNTER — Other Ambulatory Visit: Payer: Self-pay

## 2021-06-29 DIAGNOSIS — F1290 Cannabis use, unspecified, uncomplicated: Secondary | ICD-10-CM | POA: Diagnosis not present

## 2021-06-29 DIAGNOSIS — R0981 Nasal congestion: Secondary | ICD-10-CM | POA: Insufficient documentation

## 2021-06-29 DIAGNOSIS — F17210 Nicotine dependence, cigarettes, uncomplicated: Secondary | ICD-10-CM | POA: Insufficient documentation

## 2021-06-29 DIAGNOSIS — Z79899 Other long term (current) drug therapy: Secondary | ICD-10-CM | POA: Insufficient documentation

## 2021-06-29 DIAGNOSIS — J069 Acute upper respiratory infection, unspecified: Secondary | ICD-10-CM | POA: Diagnosis not present

## 2021-06-29 DIAGNOSIS — U071 COVID-19: Secondary | ICD-10-CM | POA: Insufficient documentation

## 2021-06-29 DIAGNOSIS — R519 Headache, unspecified: Secondary | ICD-10-CM | POA: Diagnosis not present

## 2021-06-29 LAB — SARS CORONAVIRUS 2 (TAT 6-24 HRS): SARS Coronavirus 2: POSITIVE — AB

## 2021-06-29 MED ORDER — PROMETHAZINE-DM 6.25-15 MG/5ML PO SYRP: 5.0000 mL | ORAL_SOLUTION | Freq: Four times a day (QID) | ORAL | 0 refills | Status: DC | PRN

## 2021-06-29 NOTE — ED Triage Notes (Signed)
Pt presents with nasal congestion and headache x 3 days. OTC meds gives no relief.

## 2021-06-29 NOTE — ED Provider Notes (Signed)
[facility]    CSN: [id number] Arrival date & time: 06/29/21  4665      History   Chief Complaint Chief Complaint  Patient presents with   Nasal Congestion   Headache    HPI Dylan Holmes is a 55 y.o. male.   Reviewed history of runny nose, congestion, scratchy throat, malaise, cough.  Denies known fever, chest pain, shortness of breath, abdominal pain, nausea vomiting or diarrhea.  So far trying DayQuil, Mucinex with mild temporary relief.  No known sick contacts recently.  No known chronic pulmonary issues that he is aware of.  Up-to-date on COVID vaccines.   History reviewed. No pertinent past medical history.  Patient Active Problem List   Diagnosis Date Noted   NSTEMI (non-ST elevated myocardial infarction) (HCC) 11/13/2020   Unstable angina (HCC) 11/13/2020   Essential hypertension 11/13/2020    Past Surgical History:  Procedure Laterality Date   APPENDECTOMY         Home Medications    Prior to Admission medications   Medication Sig Start Date End Date Taking? Authorizing Provider  promethazine-dextromethorphan (PROMETHAZINE-DM) 6.25-15 MG/5ML syrup Take 5 mLs by mouth 4 (four) times daily as needed for cough. 06/29/21  Yes Particia Nearing, PA-C  amLODipine (NORVASC) 5 MG tablet Take 1 tablet (5 mg total) by mouth daily. 12/16/20 12/11/21  Little Ishikawa, MD  Multiple Vitamins-Minerals (CENTRUM MEN PO) Take 1 tablet by mouth daily.    [provider]  omeprazole (PRILOSEC OTC) 20 MG tablet Take 1 tablet (20 mg total) by mouth daily. 11/13/20   Little Ishikawa, MD    Family History History reviewed. No pertinent family history.  Social History Social History   Tobacco Use   Smoking status: Every Day    Packs/day: 1.00    Types: Cigarettes   Smokeless tobacco: Never  Substance Use Topics   Alcohol use: Yes   Drug use: Yes    Types: Marijuana     Allergies   Patient has no known allergies.   Review of  Systems Review of Systems Per HPI  Physical Exam Triage Vital Signs ED Triage Vitals  Enc Vitals Group     BP 06/29/21 0844 (!) 145/83     Pulse Rate 06/29/21 0844 68     Resp 06/29/21 0844 18     Temp 06/29/21 0844 98.4 F (36.9 C)     Temp Source 06/29/21 0844 Oral     SpO2 06/29/21 0844 98 %     Weight --      Height --      Head Circumference --      Peak Flow --      Pain Score 06/29/21 0841 8     Pain Loc --      Pain Edu? --      Excl. in GC? --    No data found.  Updated Vital Signs BP (!) 145/83 (BP Location: Right Arm)   Pulse 68   Temp 98.4 F (36.9 C) (Oral)   Resp 18   SpO2 98%   Visual Acuity Right Eye Distance:   Left Eye Distance:   Bilateral Distance:    Right Eye Near:   Left Eye Near:    Bilateral Near:     Physical Exam Vitals and nursing note reviewed.  Constitutional:      Appearance: Normal appearance.  HENT:     Head: Atraumatic.     Right Ear: Tympanic membrane normal.  Left Ear: Tympanic membrane normal.     Nose: Rhinorrhea present.     Mouth/Throat:     Mouth: Mucous membranes are moist.     Pharynx: Oropharynx is clear. Posterior oropharyngeal erythema present. No oropharyngeal exudate.  Eyes:     Extraocular Movements: Extraocular movements intact.     Conjunctiva/sclera: Conjunctivae normal.  Cardiovascular:     Rate and Rhythm: Normal rate and regular rhythm.  Pulmonary:     Effort: Pulmonary effort is normal.     Breath sounds: Normal breath sounds. No wheezing or rales.  Abdominal:     General: Bowel sounds are normal. There is no distension.     Palpations: Abdomen is soft.     Tenderness: There is no abdominal tenderness. There is no guarding.  Musculoskeletal:        General: Normal range of motion.     Cervical back: Normal range of motion and neck supple.  Skin:    General: Skin is warm and dry.  Neurological:     General: No focal deficit present.     Mental Status: He is oriented to person, place,  and time.  Psychiatric:        Mood and Affect: Mood normal.        Thought Content: Thought content normal.        Judgment: Judgment normal.   UC Treatments / Results  Labs (all labs ordered are listed, but only abnormal results are displayed) Labs Reviewed  SARS CORONAVIRUS 2 (TAT 6-24 HRS)    EKG   Radiology No results found.  Procedures Procedures (including critical care time)  Medications Ordered in UC Medications - No data to display  Initial Impression / Assessment and Plan / UC Course  I have reviewed the triage vital signs and the nursing notes.  Pertinent labs & imaging results that were available during my care of the patient were reviewed by me and considered in my medical decision making (see chart for details).     Vitals and exam benign and well-appearing today.  COVID PCR pending, discussed over-the-counter symptomatic management and supportive home care.  Phenergan DM sent in case with cough worsens.  Work note given with Barrister's clerk.  Return for acutely worsening symptoms.  Final Clinical Impressions(s) / UC Diagnoses   Final diagnoses:  Viral URI with cough   Discharge Instructions   None    ED Prescriptions     Medication Sig Dispense Auth. Provider   promethazine-dextromethorphan (PROMETHAZINE-DM) 6.25-15 MG/5ML syrup Take 5 mLs by mouth 4 (four) times daily as needed for cough. 100 mL Particia Nearing, [place]      PDMP not reviewed this encounter.   Particia Nearing, [place] 06/29/21 [phone]

## 2021-12-16 ENCOUNTER — Encounter (HOSPITAL_COMMUNITY): Payer: Self-pay | Admitting: Emergency Medicine

## 2021-12-16 ENCOUNTER — Emergency Department (HOSPITAL_COMMUNITY): Payer: 59

## 2021-12-16 ENCOUNTER — Emergency Department (HOSPITAL_COMMUNITY)
Admission: EM | Admit: 2021-12-16 | Discharge: 2021-12-16 | Disposition: A | Discharge status: Home / Self Care | Payer: 59 | Attending: Emergency Medicine | Admitting: Emergency Medicine

## 2021-12-16 DIAGNOSIS — M545 Low back pain, unspecified: Secondary | ICD-10-CM | POA: Insufficient documentation

## 2021-12-16 DIAGNOSIS — Z79899 Other long term (current) drug therapy: Secondary | ICD-10-CM | POA: Insufficient documentation

## 2021-12-16 DIAGNOSIS — R109 Unspecified abdominal pain: Secondary | ICD-10-CM | POA: Diagnosis not present

## 2021-12-16 DIAGNOSIS — I10 Essential (primary) hypertension: Secondary | ICD-10-CM | POA: Insufficient documentation

## 2021-12-16 HISTORY — DX: Essential (primary) hypertension: I10

## 2021-12-16 LAB — COMPREHENSIVE METABOLIC PANEL
ALT: 15 U/L (ref 0–44)
AST: 16 U/L (ref 15–41)
Albumin: 3.8 g/dL (ref 3.5–5.0)
Alkaline Phosphatase: 77 U/L (ref 38–126)
Anion gap: 6 (ref 5–15)
BUN: 10 mg/dL (ref 6–20)
CO2: 25 mmol/L (ref 22–32)
Calcium: 8.8 mg/dL — ABNORMAL LOW (ref 8.9–10.3)
Chloride: 109 mmol/L (ref 98–111)
Creatinine, Ser: 0.93 mg/dL (ref 0.61–1.24)
GFR, Estimated: >60 mL/min (ref >60)
Glucose, Bld: 90 mg/dL (ref 70–99)
Potassium: 4.1 mmol/L (ref 3.5–5.1)
Sodium: 140 mmol/L (ref 135–145)
Total Bilirubin: 0.4 mg/dL (ref 0.3–1.2)
Total Protein: 6.7 g/dL (ref 6.5–8.1)

## 2021-12-16 LAB — URINALYSIS, ROUTINE W REFLEX MICROSCOPIC
Bilirubin Urine: NEGATIVE
Glucose, UA: NEGATIVE mg/dL
Hgb urine dipstick: NEGATIVE
Ketones, ur: NEGATIVE mg/dL
Leukocytes,Ua: NEGATIVE
Nitrite: NEGATIVE
Protein, ur: NEGATIVE mg/dL
Specific Gravity, Urine: 1.015 (ref 1.005–1.030)
pH: 6 (ref 5.0–8.0)

## 2021-12-16 LAB — CBC
HCT: 42.2 % (ref 39.0–52.0)
Hemoglobin: 13.9 g/dL (ref 13.0–17.0)
MCH: 29 pg (ref 26.0–34.0)
MCHC: 32.9 g/dL (ref 30.0–36.0)
MCV: 87.9 fL (ref 80.0–100.0)
Platelets: 223 10*3/uL (ref 150–400)
RBC: 4.8 MIL/uL (ref 4.22–5.81)
RDW: 11.9 % (ref 11.5–15.5)
WBC: 5.1 10*3/uL (ref 4.0–10.5)
nRBC: 0 % (ref 0.0–0.2)

## 2021-12-16 LAB — LIPASE, BLOOD: Lipase: 38 U/L (ref 11–51)

## 2021-12-16 MED ORDER — HYDROMORPHONE HCL 1 MG/ML IJ SOLN
1.0000 mg | Freq: Once | INTRAMUSCULAR | Status: AC
  Administered 2021-12-16: 1 mg via INTRAVENOUS
  Filled 2021-12-16: qty 1

## 2021-12-16 MED ORDER — KETOROLAC TROMETHAMINE 30 MG/ML IJ SOLN
30.0000 mg | Freq: Once | INTRAMUSCULAR | Status: AC
  Administered 2021-12-16: 30 mg via INTRAVENOUS
  Filled 2021-12-16: qty 1

## 2021-12-16 NOTE — ED Notes (Signed)
IV team at bedside 

## 2021-12-16 NOTE — ED Notes (Signed)
Dr Rosalia Hammers at bedside to discuss options if unable to tolerate CT

## 2021-12-16 NOTE — ED Provider Notes (Signed)
[facility] Provider Note   CSN: [id number] Arrival date & time: 12/16/21  1306     History  Chief Complaint  Patient presents with   Flank Pain    Dylan Holmes is a 56 y.o. male presenting to the Emergency Department with left-sided back pain.  Patient reports insidious onset of his pain about a week ago.  He denies any traumas or falls.  He feels that he woke up with pain in his left lower back.  It has been constant, persistent for a week.  It does not radiate anywhere.  It is worse when he moves or twists his back or when he lifts his left leg.  He states the pain is reminiscent of when he had appendicitis years ago.  He denies nausea or vomiting, reports some loose bowel movements today.  He denies history of kidney stones, reports some mild dysuria, denies urinary pressure.  He says his pain is currently 10 out of 10.  He denies any drug allergies.  He reports he did not take his blood pressure medications today, denies any medical issues aside from hypertension.  HPI     Home Medications Prior to Admission medications   Medication Sig Start Date End Date Taking? Authorizing Provider  amLODipine (NORVASC) 5 MG tablet Take 1 tablet (5 mg total) by mouth daily. 12/16/20 12/11/21  Donato Heinz, MD  Multiple Vitamins-Minerals (CENTRUM MEN PO) Take 1 tablet by mouth daily.    [provider]  omeprazole (PRILOSEC OTC) 20 MG tablet Take 1 tablet (20 mg total) by mouth daily. 11/13/20   Donato Heinz, MD  promethazine-dextromethorphan (PROMETHAZINE-DM) 6.25-15 MG/5ML syrup Take 5 mLs by mouth 4 (four) times daily as needed for cough. 06/29/21   Volney American, PA-C      Allergies    Patient has no known allergies.    Review of Systems   Review of Systems  Constitutional:  Negative for chills and fever.  Respiratory:  Negative for cough and shortness of breath.   Cardiovascular:  Negative for chest pain and  palpitations.  Gastrointestinal:  Positive for diarrhea. Negative for abdominal pain, nausea and vomiting.  Genitourinary:  Positive for dysuria and flank pain. Negative for difficulty urinating and hematuria.  Musculoskeletal:  Positive for arthralgias, back pain and myalgias.  Skin:  Negative for color change and rash.  Neurological:  Negative for syncope and light-headedness.  All other systems reviewed and are negative.  Physical Exam Updated Vital Signs BP (!) 161/98    Pulse 68    Temp 98.6 F (37 C)    Resp 18    Ht 5' 10.5" (1.791 m)    Wt 97.5 kg    SpO2 98%    BMI 30.41 kg/m  Physical Exam Constitutional:      General: He is not in acute distress. HENT:     Head: Normocephalic and atraumatic.  Eyes:     Conjunctiva/sclera: Conjunctivae normal.     Pupils: Pupils are equal, round, and reactive to light.  Cardiovascular:     Rate and Rhythm: Normal rate and regular rhythm.     Pulses: Normal pulses.  Pulmonary:     Effort: Pulmonary effort is normal. No respiratory distress.  Abdominal:     General: There is no distension.     Tenderness: There is no guarding.  Musculoskeletal:     Comments: +Left CVA and left paraspinal ttp Mild LLQ ttp +Straight leg test left side  Skin:    General: Skin is warm and dry.  Neurological:     General: No focal deficit present.     Mental Status: He is alert and oriented to person, place, and time. Mental status is at baseline.  Psychiatric:        Mood and Affect: Mood normal.        Behavior: Behavior normal.    ED Results / Procedures / Treatments   Labs (all labs ordered are listed, but only abnormal results are displayed) Labs Reviewed  COMPREHENSIVE METABOLIC PANEL - Abnormal; Notable for the following components:      Result Value   Calcium 8.8 (*)    All other components within normal limits  CBC  LIPASE, BLOOD  URINALYSIS, ROUTINE W REFLEX MICROSCOPIC    EKG None  Radiology No results  found.  Procedures Procedures    Medications Ordered in ED Medications  HYDROmorphone (DILAUDID) injection 1 mg (has no administration in time range)  ketorolac (TORADOL) 30 MG/ML injection 30 mg (has no administration in time range)    ED Course/ Medical Decision Making/ A&P                           Medical Decision Making  This patient presents to the ED with concern for abdominal pain and flank pain. This involves an extensive number of treatment options, and is a complaint that carries with it a high risk of complications and morbidity.  The differential diagnosis includes ureteral colic versus pyelonephritis versus UTI versus colitis versus paraspinal spasm versus herniated lumbar disc or radiculopathy  I ordered and personally interpreted labs.  The pertinent results include:   No evidence of infection on the UA.  CBC and CMP largely unremarkable.  No significant leukocytosis.  Lipase within normal limits.  I ordered imaging studies including CT abdomen pelvis, which was pending at the time of signout  I ordered medication including IV Toradol, IV Dilaudid for pain  External records reviewed including CT abdomen in March 2021 showing diverticulosis, no AAA, hx of appendectomy  Test Considered:  -Unlikely to be AAA with no significant risk factors of aortic aneurysm, no pulsatile abdominal mass.   After the interventions noted above, I reevaluated the patient and found that they have: improved    Patient signed to Dr Jeanell Sparrow EDP pending CT scan of the abdomen        Final Clinical Impression(s) / ED Diagnoses Final diagnoses:  None    Rx / DC Orders ED Discharge Orders     None         Illana Nolting, Carola Rhine, MD 12/17/21 [phone]

## 2021-12-16 NOTE — ED Notes (Signed)
ED Provider at bedside. 

## 2021-12-16 NOTE — ED Triage Notes (Signed)
Pt endorses left flank pain for one week. Endorses tenderness. Denies urinary symptoms. Pt has not taken HTN meds today.

## 2021-12-16 NOTE — ED Provider Triage Note (Signed)
Emergency Medicine Provider Triage Evaluation Note  Dylan Holmes , a 56 y.o. male  was evaluated in triage.  Pt complains of left flank pain. States that same has been ongoing for the past week without change. Denies urinary symptoms. No hx of kidney stones. States that he does drink a lot of soda. Pain intermittent and 5/10 at worse.  Review of Systems  Positive:  Negative: See above  Physical Exam  BP (!) 182/96    Pulse 62    Temp 98.6 F (37 C)    Resp 20    Ht 5' 10.5" (1.791 m)    Wt 97.5 kg    SpO2 97%    BMI 30.41 kg/m  Gen:   Awake, no distress   Resp:  Normal effort  MSK:   Moves extremities without difficulty  Other:  Mild L CVA tenderness  Medical Decision Making  Medically screening exam initiated at 1:52 PM.  Appropriate orders placed.  Dylan Holmes was informed that the remainder of the evaluation will be completed by another provider, this initial triage assessment does not replace that evaluation, and the importance of remaining in the ED until their evaluation is complete.     Dylan Face, PA-C 12/16/21 1355

## 2021-12-16 NOTE — ED Notes (Signed)
Son at bedside, states pt incontinent of urine, requests primofit for urine collection due to lasix administration.

## 2021-12-16 NOTE — ED Provider Notes (Signed)
56 yo male with left low back pain. Physical Exam  BP (!) 170/95    Pulse (!) 56    Temp 98.6 F (37 C)    Resp 18    Ht 1.791 m (5' 10.5")    Wt 97.5 kg    SpO2 96%    BMI 30.41 kg/m   Physical Exam  Procedures  Procedures  ED Course / MDM    Medical Decision Making  TTP left back and some llq pain. DDX- stone, colitis, musculoskeletal back pain CT pending. Will d/c of ct negative and pain controlled.  Patient states he is unable to have a CAT scan that is making him too claustrophobic. Discussed that the CT scanner is open and takes a matter of minutes to perform.  Patient states he is unable to do this. Discussed results and differential diagnosis with him.  Discussed risks and benefits of having CAT scan done.  He advises that he is just unable to do this.  Patient appears to have normal labs, be hemodynamically stable, no acute abnormalities noted on my physical exam with abdomen soft and nontender.  Patient advised regarding return precautions and outpatient follow-up      Pattricia Boss, MD 12/16/21 1801

## 2021-12-16 NOTE — ED Notes (Signed)
Attempt at PIV access unsuccessful x2 nurses, order for IV team placed.

## 2021-12-16 NOTE — Discharge Instructions (Addendum)
Labs and urine appear normal today Please return if you are having worsening symptoms especially increased pain or inability to tolerate liquids Please obtain primary care and follow-up outpatient next week.

## 2021-12-16 NOTE — ED Notes (Signed)
Pt returned from [place] states unable to tolerate exam due to claustrophobia

## 2021-12-18 ENCOUNTER — Emergency Department: Payer: 59

## 2021-12-18 ENCOUNTER — Other Ambulatory Visit: Payer: Self-pay

## 2021-12-18 ENCOUNTER — Emergency Department
Admission: EM | Admit: 2021-12-18 | Discharge: 2021-12-18 | Disposition: A | Discharge status: Home / Self Care | Payer: 59 | Attending: Emergency Medicine | Admitting: Emergency Medicine

## 2021-12-18 DIAGNOSIS — R109 Unspecified abdominal pain: Secondary | ICD-10-CM | POA: Insufficient documentation

## 2021-12-18 DIAGNOSIS — M545 Low back pain, unspecified: Secondary | ICD-10-CM | POA: Insufficient documentation

## 2021-12-18 LAB — BASIC METABOLIC PANEL
Anion gap: 7 (ref 5–15)
BUN: 14 mg/dL (ref 6–20)
CO2: 27 mmol/L (ref 22–32)
Calcium: 9.1 mg/dL (ref 8.9–10.3)
Chloride: 104 mmol/L (ref 98–111)
Creatinine, Ser: 1.03 mg/dL (ref 0.61–1.24)
GFR, Estimated: >60 mL/min (ref >60)
Glucose, Bld: 133 mg/dL — ABNORMAL HIGH (ref 70–99)
Potassium: 3.7 mmol/L (ref 3.5–5.1)
Sodium: 138 mmol/L (ref 135–145)

## 2021-12-18 LAB — CBC
HCT: 44.4 % (ref 39.0–52.0)
Hemoglobin: 14.4 g/dL (ref 13.0–17.0)
MCH: 28.2 pg (ref 26.0–34.0)
MCHC: 32.4 g/dL (ref 30.0–36.0)
MCV: 87.1 fL (ref 80.0–100.0)
Platelets: 221 10*3/uL (ref 150–400)
RBC: 5.1 MIL/uL (ref 4.22–5.81)
RDW: 11.9 % (ref 11.5–15.5)
WBC: 5.3 10*3/uL (ref 4.0–10.5)
nRBC: 0 % (ref 0.0–0.2)

## 2021-12-18 LAB — URINALYSIS, ROUTINE W REFLEX MICROSCOPIC
Bilirubin Urine: NEGATIVE
Glucose, UA: NEGATIVE mg/dL
Hgb urine dipstick: NEGATIVE
Ketones, ur: NEGATIVE mg/dL
Leukocytes,Ua: NEGATIVE
Nitrite: NEGATIVE
Protein, ur: NEGATIVE mg/dL
Specific Gravity, Urine: 1.012 (ref 1.005–1.030)
pH: 5 (ref 5.0–8.0)

## 2021-12-18 MED ORDER — IBUPROFEN 600 MG PO TABS: 600.0000 mg | ORAL_TABLET | Freq: Three times a day (TID) | ORAL | 0 refills | Status: AC | PRN

## 2021-12-18 MED ORDER — CYCLOBENZAPRINE HCL 10 MG PO TABS: 10.0000 mg | ORAL_TABLET | Freq: Three times a day (TID) | ORAL | 0 refills | Status: DC | PRN

## 2021-12-18 NOTE — ED Provider Triage Note (Signed)
Emergency Medicine Provider Triage Evaluation Note  Dylan Holmes , a 56 y.o. male  was evaluated in triage.  Pt complains of left flank pain. No dysuria. Pain started 2 weeks ago.   Review of Systems  Positive: Left flank pain Negative: Fever  Physical Exam  There were no vitals taken for this visit. Gen:   Awake, no distress   Resp:  Normal effort  MSK:   Moves extremities without difficulty  Other:    Medical Decision Making  Medically screening exam initiated at 11:31 AM.  Appropriate orders placed.  Dylan Holmes was informed that the remainder of the evaluation will be completed by another provider, this initial triage assessment does not replace that evaluation, and the importance of remaining in the ED until their evaluation is complete.    Chinita Pester, FNP 12/18/21 1139

## 2021-12-18 NOTE — ED Triage Notes (Signed)
Pt to ED for left flank pain x2 weeks. Seen at [facility] for same a couple days ago. Denies injury. Denies n/v or urinary sx

## 2021-12-27 NOTE — ED Provider Notes (Signed)
[facility] Provider Note    Event Date/Time   First MD Initiated Contact with Patient 12/18/21 1225     (approximate)   History   Flank Pain   HPI Ruddy Swire is a 56 y.o. male who presents for 2 weeks of left flank pain.  Patient describes his pain as a sharp, nonradiating, 8/10 left side and flank pain that is not associated with any nausea/vomiting/urinary symptoms.  Patient denies any fall or recent injury.  Patient states that he was recently seen in [facility] 2 days ago for similar symptoms and was told that it was musculoskeletal.     Physical Exam   Triage Vital Signs: ED Triage Vitals [12/18/21 1133]  Enc Vitals Group     BP (!) 145/92     Pulse Rate 82     Resp 18     Temp 98.7 F (37.1 C)     Temp Source Oral     SpO2 93 %     Weight 216 lb 0.8 oz (98 kg)     Height 5\' 10"  (1.778 m)     Head Circumference      Peak Flow      Pain Score 8     Pain Loc      Pain Edu?      Excl. in GC?     Most recent vital signs: Vitals:   12/18/21 1133  BP: (!) 145/92  Pulse: 82  Resp: 18  Temp: 98.7 F (37.1 C)  SpO2: 93%    General: Awake, no distress.  CV:  Good peripheral perfusion.  Resp:  Normal effort.  Abd:  No distention.  Other:  Tenderness to palpation over the left lower lumbar paraspinal musculature   ED Results / Procedures / Treatments   Labs (all labs ordered are listed, but only abnormal results are displayed) Labs Reviewed  URINALYSIS, ROUTINE W REFLEX MICROSCOPIC - Abnormal; Notable for the following components:      Result Value   Color, Urine YELLOW (*)    APPearance CLEAR (*)    All other components within normal limits  BASIC METABOLIC PANEL - Abnormal; Notable for the following components:   Glucose, Bld 133 (*)    All other components within normal limits  CBC    RADIOLOGY  ED MD interpretation: CT renal stone study of the abdomen only shows circumferential bladder wall thickening without any  evidence of acute kidney stones or other abnormalities.  Official radiology report(s): No results found.    PROCEDURES:  Critical Care performed: No  Procedures   MEDICATIONS ORDERED IN ED: Medications - No data to display   IMPRESSION / MDM / ASSESSMENT AND PLAN / ED COURSE  I reviewed the triage vital signs and the nursing notes.                              Differential diagnosis includes, but is not limited to, spinal epidural abscess, cauda equina syndrome, vertebral fracture, kidney stone, a sending urinary tract infection  The patient is on the cardiac monitor to evaluate for evidence of arrhythmia and/or significant heart rate changes.  Patient presents for low back pain. Given History and Exam the patient appears to be at low risk for Spinal Cord Compression Syndrome, Vertebral Malignancy/Mets, acute Spinal Fracture, Vertebral Osteomyelitis, Epidural Abscess, Infected or Obstructing Kidney Stone.  Their presentation appears most likely to be secondary to non-emergent musculoskeletal  etiology vs non-emergent disc herniation.  ED Workup: CT renal stone study only shows circumferential bladder wall thickening and patient is without any dysuria, polyuria and UA showing no evidence of acute infection BMP only showing evidence of mild glucose elevation of 133  Disposition: Discharge. Strict return precautions discussed with patient with full understanding. Advised patient to follow up promptly with primary care provider       FINAL CLINICAL IMPRESSION(S) / ED DIAGNOSES   Final diagnoses:  Acute left-sided low back pain without sciatica     Rx / DC Orders   ED Discharge Orders          Ordered    cyclobenzaprine (FLEXERIL) 10 MG tablet  3 times daily PRN        12/18/21 1334    ibuprofen (ADVIL) 600 MG tablet  Every 8 hours PRN        12/18/21 1334             Note:  This document was prepared using Dragon voice recognition software and may include  unintentional dictation errors.   Merwyn Katos, MD 12/27/21 [phone]

## 2022-03-16 ENCOUNTER — Emergency Department
Admission: EM | Admit: 2022-03-16 | Discharge: 2022-03-16 | Disposition: A | Discharge status: Home / Self Care | Payer: 59 | Attending: Emergency Medicine | Admitting: Emergency Medicine

## 2022-03-16 ENCOUNTER — Other Ambulatory Visit: Payer: Self-pay

## 2022-03-16 DIAGNOSIS — I10 Essential (primary) hypertension: Secondary | ICD-10-CM | POA: Diagnosis not present

## 2022-03-16 DIAGNOSIS — N4821 Abscess of corpus cavernosum and penis: Secondary | ICD-10-CM | POA: Insufficient documentation

## 2022-03-16 MED ORDER — SULFAMETHOXAZOLE-TRIMETHOPRIM 800-160 MG PO TABS: 1.0000 | ORAL_TABLET | Freq: Two times a day (BID) | ORAL | 0 refills | Status: DC

## 2022-03-16 NOTE — ED Provider Notes (Signed)
[facility] ?Emergency Department Provider Note ? ?____________________________________________ ? ?Time seen: Approximately 2:10 PM ? ?I have reviewed the triage vital signs and the nursing notes. ? ? ?HISTORY ? ?Chief Complaint ?Abscess ? ? ?HPI ?Dylan Holmes is a 56 y.o. male presents to the emergency department for treatment and evaluation of skin abscess of the foreskin. At age 37 he was circumcised and ever since there has been a small area that opens occasionally but has never had infection in it. For the past few days, the area has been getting larger and was really sore. Yesterday, his wife squeezed it until it popped and large amount of malodorous fluid came out. The area is smaller, but he wasn't sure what else he needed for treatment. ? ?Past Medical History:  ?Diagnosis Date  ? Hypertension   ? ? ?Patient Active Problem List  ? Diagnosis Date Noted  ? NSTEMI (non-ST elevated myocardial infarction) (HCC) 11/13/2020  ? Unstable angina (HCC) 11/13/2020  ? Essential hypertension 11/13/2020  ? ? ?Past Surgical History:  ?Procedure Laterality Date  ? APPENDECTOMY    ? ? ?Allergies ?Patient has no known allergies. ? ?No family history on file. ? ?____________________________________________ ? ? ?PHYSICAL EXAM: ? ?VITAL SIGNS: ?ED Triage Vitals  ?Enc Vitals Group  ?   BP 03/16/22 1250 (!) 160/104  ?   Pulse Rate 03/16/22 1250 74  ?   Resp 03/16/22 1250 18  ?   Temp 03/16/22 1250 98.7 ?F (37.1 ?C)  ?   Temp Source 03/16/22 1232 Oral  ?   SpO2 03/16/22 1250 95 %  ?   Weight 03/16/22 1312 216 lb 0.8 oz (98 kg)  ?   Height 03/16/22 1251 5\' 11"  (1.803 m)  ?   Head Circumference --   ?   Peak Flow --   ?   Pain Score 03/16/22 1251 5  ?   Pain Loc --   ?   Pain Edu? --   ?   Excl. in GC? --   ? ?Constitutional: Overall well appearing. ?Mouth/Throat: Airway is patent.  ?Neck: No stridor. Unrestricted range of motion observed. ?Cardiovascular: Capillary refill is <3 seconds.  ?Respiratory:  Respirations are even and unlabored.05/16/22 ?Musculoskeletal: Unrestricted range of motion observed. ?Neurologic: Awake, alert, and oriented x 4.  ?Skin:  Open area on foreskin with surrounding induration. No fluctuant area noted. ? ?____________________________________________ ?  ?LABS ?(all labs ordered are listed, but only abnormal results are displayed) ? ?Labs Reviewed - No data to display ?____________________________________________ ? ?EKG ? ?Not indicated. ?____________________________________________ ? ?RADIOLOGY ? ?Not indicated. ?____________________________________________ ? ? ?PROCEDURES ? ?Procedures ?____________________________________________ ? ? ?INITIAL IMPRESSION / ASSESSMENT AND PLAN / ED COURSE ? ?Dylan Holmes is a 57 y.o. male presenting to the emergency department for treatment and evaluation of abscess to to the foreskin.  See HPI for further details. ? ?On exam, there is no additional fluctuance in the area.  There is some induration directly surrounding the open area. ? ?Plan will be to have him use warm compresses 3-4 times per day to encourage any additional drainage.  He will be placed on Bactrim to be taken for the next 10 days.  If the area closes and begins to swell and become tender again, he is to follow-up with surgery.  If symptoms change or worsen and he is unable to get an appointment he is to return to the emergency department. ? ?Medications - No data to display ? ? ?Pertinent labs & imaging  results that were available during my care of the patient were reviewed by me and considered in my medical decision making (see chart for details). ? ____________________________________________ ? ? ?FINAL CLINICAL IMPRESSION(S) / ED DIAGNOSES ? ?Final diagnoses:  ?Abscess of penis  ? ? ?ED Discharge Orders   ? ?      Ordered  ?  sulfamethoxazole-trimethoprim (BACTRIM DS) 800-160 MG tablet  2 times daily       ? 03/16/22 1402  ? ?  ?  ? ?  ? ? ? ?Note:  This document was prepared using Dragon  voice recognition software and may include unintentional dictation errors. ? ?  ?Chinita Pester, FNP ?03/16/22 1419 ? ?  ?Gilles Chiquito, MD ?03/16/22 1539 ? ?

## 2022-03-16 NOTE — Discharge Instructions (Addendum)
Warm compress 2-3 times per day. ?Take the antibiotic as prescribed and until finished. ?You may continue using Vaseline and gauze over the area. ?If the area begins to become more swollen and fill with more fluid, please follow-up with the surgeon as listed above. ?Return to the emergency department for symptoms of change or worsen if you are unable to schedule appointment. ?

## 2022-03-16 NOTE — ED Triage Notes (Signed)
Pt states he has a swollen tender area to the foreskin of his penis for the past 3-4 days, states his wife "popped it" and it has been drainage, states the swelling is better, just a very small open area where they popped ?

## 2022-04-09 ENCOUNTER — Emergency Department: Payer: 59

## 2022-04-09 ENCOUNTER — Encounter: Payer: Self-pay | Admitting: Emergency Medicine

## 2022-04-09 ENCOUNTER — Emergency Department
Admission: EM | Admit: 2022-04-09 | Discharge: 2022-04-09 | Disposition: A | Discharge status: Home / Self Care | Payer: 59 | Attending: Emergency Medicine | Admitting: Emergency Medicine

## 2022-04-09 ENCOUNTER — Other Ambulatory Visit: Payer: Self-pay

## 2022-04-09 DIAGNOSIS — J209 Acute bronchitis, unspecified: Secondary | ICD-10-CM | POA: Diagnosis not present

## 2022-04-09 DIAGNOSIS — R079 Chest pain, unspecified: Secondary | ICD-10-CM | POA: Diagnosis present

## 2022-04-09 LAB — BASIC METABOLIC PANEL
Anion gap: 9 (ref 5–15)
BUN: 15 mg/dL (ref 6–20)
CO2: 26 mmol/L (ref 22–32)
Calcium: 9 mg/dL (ref 8.9–10.3)
Chloride: 104 mmol/L (ref 98–111)
Creatinine, Ser: 0.87 mg/dL (ref 0.61–1.24)
GFR, Estimated: >60 mL/min (ref >60)
Glucose, Bld: 134 mg/dL — ABNORMAL HIGH (ref 70–99)
Potassium: 3.7 mmol/L (ref 3.5–5.1)
Sodium: 139 mmol/L (ref 135–145)

## 2022-04-09 LAB — CBC
HCT: 46.9 % (ref 39.0–52.0)
Hemoglobin: 15.1 g/dL (ref 13.0–17.0)
MCH: 28.2 pg (ref 26.0–34.0)
MCHC: 32.2 g/dL (ref 30.0–36.0)
MCV: 87.5 fL (ref 80.0–100.0)
Platelets: 235 10*3/uL (ref 150–400)
RBC: 5.36 MIL/uL (ref 4.22–5.81)
RDW: 12.3 % (ref 11.5–15.5)
WBC: 6.2 10*3/uL (ref 4.0–10.5)
nRBC: 0 % (ref 0.0–0.2)

## 2022-04-09 LAB — TROPONIN I (HIGH SENSITIVITY): Troponin I (High Sensitivity): 9 ng/L (ref <18)

## 2022-04-09 MED ORDER — HYDROCOD POLI-CHLORPHE POLI ER 10-8 MG/5ML PO SUER: 5.0000 mL | Freq: Every evening | ORAL | 0 refills | Status: DC | PRN

## 2022-04-09 MED ORDER — DEXAMETHASONE SODIUM PHOSPHATE 10 MG/ML IJ SOLN
10.0000 mg | Freq: Once | INTRAMUSCULAR | Status: AC
  Administered 2022-04-09: 10 mg via INTRAMUSCULAR
  Filled 2022-04-09: qty 1

## 2022-04-09 MED ORDER — IPRATROPIUM-ALBUTEROL 0.5-2.5 (3) MG/3ML IN SOLN
3.0000 mL | Freq: Once | RESPIRATORY_TRACT | Status: AC
  Administered 2022-04-09: 3 mL via RESPIRATORY_TRACT
  Filled 2022-04-09: qty 3

## 2022-04-09 MED ORDER — METHYLPREDNISOLONE 4 MG PO TBPK: ORAL_TABLET | ORAL | 0 refills | Status: DC

## 2022-04-09 MED ORDER — IPRATROPIUM-ALBUTEROL 0.5-2.5 (3) MG/3ML IN SOLN
3.0000 mL | Freq: Once | RESPIRATORY_TRACT | Status: AC
  Filled 2022-04-09: qty 3

## 2022-04-09 MED ORDER — ALBUTEROL SULFATE HFA 108 (90 BASE) MCG/ACT IN AERS: 2.0000 | INHALATION_SPRAY | Freq: Four times a day (QID) | RESPIRATORY_TRACT | 2 refills | Status: DC | PRN

## 2022-04-09 MED ORDER — AMOXICILLIN 875 MG PO TABS: 875.0000 mg | ORAL_TABLET | Freq: Two times a day (BID) | ORAL | 0 refills | Status: DC

## 2022-04-09 MED ORDER — IPRATROPIUM-ALBUTEROL 0.5-2.5 (3) MG/3ML IN SOLN
RESPIRATORY_TRACT | Status: AC
  Administered 2022-04-09: 3 mL via RESPIRATORY_TRACT
  Filled 2022-04-09: qty 3

## 2022-04-09 NOTE — ED Triage Notes (Signed)
Pt via POV from home. Pt c/o generalized chest pain, cough, and intermittent SOB for the past week. Pt has a cardiac hx but denies any MI. Pt is A&Ox4 and NAD ?

## 2022-04-09 NOTE — Discharge Instructions (Signed)
Take your medications as prescribed.  The antibiotic is twice daily for 10 days.  The steroid pack you will start tomorrow.  The inhaler you will use every 4-6 hours as needed for wheezing.  And the cough medicine is strictly for at night as it does have a narcotic in it. ?

## 2022-04-09 NOTE — ED Provider Notes (Signed)
? ?[facility] ?Provider Note ? ? ? Event Date/Time  ? First MD Initiated Contact with Patient 04/09/22 1434   ?  (approximate) ? ? ?History  ? ?Chest Pain ? ? ?HPI ? ?Dylan Holmes is a 56 y.o. male with history of NSTEMI and unstable angina presents emergency department complaining of chest pain and shortness of breath.  Patient states that he has had a cough for about a week and now he is wheezing and feeling short of breath.  No swelling in his lower extremities.  Patient states he did not try to use his nitroglycerin as it was not cardiac type chest pain.  He denies fever, chills. ? ?  ? ? ?Physical Exam  ? ?Triage Vital Signs: ?ED Triage Vitals  ?Enc Vitals Group  ?   BP 04/09/22 1259 (!) 160/94  ?   Pulse Rate 04/09/22 1259 75  ?   Resp 04/09/22 1259 18  ?   Temp 04/09/22 1259 97.8 ?F (36.6 ?C)  ?   Temp Source 04/09/22 1259 Oral  ?   SpO2 04/09/22 1259 98 %  ?   Weight 04/09/22 1258 216 lb (98 kg)  ?   Height 04/09/22 1258 5' 10.5" (1.791 m)  ?   Head Circumference --   ?   Peak Flow --   ?   Pain Score 04/09/22 1258 8  ?   Pain Loc --   ?   Pain Edu? --   ?   Excl. in GC? --   ? ? ?Most recent vital signs: ?Vitals:  ? 04/09/22 1259 04/09/22 1533  ?BP: (!) 160/94 (!) 158/88  ?Pulse: 75 70  ?Resp: 18 18  ?Temp: 97.8 ?F (36.6 ?C)   ?SpO2: 98% 98%  ? ? ? ?General: Awake, no distress.   ?CV:  Good peripheral perfusion. regular rate and  rhythm ?Resp:  Normal effort. Lungs with wheezing bilaterally ?Abd:  No distention.   ?Other:  No swelling in lower extremities ? ? ?ED Results / Procedures / Treatments  ? ?Labs ?(all labs ordered are listed, but only abnormal results are displayed) ?Labs Reviewed  ?BASIC METABOLIC PANEL - Abnormal; Notable for the following components:  ?    Result Value  ? Glucose, Bld 134 (*)   ? All other components within normal limits  ?CBC  ?TROPONIN I (HIGH SENSITIVITY)  ? ? ? ?EKG ? ?EKG ? ? ?RADIOLOGY ?Chest  x-ray ? ? ? ?PROCEDURES: ? ? ?Procedures ? ? ?MEDICATIONS ORDERED IN ED: ?Medications  ?ipratropium-albuterol (DUONEB) 0.5-2.5 (3) MG/3ML nebulizer solution 3 mL (3 mLs Nebulization Given 04/09/22 1446)  ?ipratropium-albuterol (DUONEB) 0.5-2.5 (3) MG/3ML nebulizer solution 3 mL (3 mLs Nebulization Given 04/09/22 1539)  ?dexamethasone (DECADRON) injection 10 mg (10 mg Intramuscular Given 04/09/22 1509)  ? ? ? ?IMPRESSION / MDM / ASSESSMENT AND PLAN / ED COURSE  ?I reviewed the triage vital signs and the nursing notes. ?             ?               ? ?Differential diagnosis includes, but is not limited to, acute bronchitis, COVID, influenza, MI, CHF ? ?The patient's labs are very reassuring, his CBC, metabolic panel and troponin are all normal. ? ?Patient's physical exam is consistent with acute bronchitis due to the amount of wheezing.  He was given 2 DuoNeb's and a Decadron 10 mg IM.   ? ?EKG shows normal sinus rhythm, see physician read ? ?  Chest x-ray independently reviewed by me confirmed by radiology to be negative for any acute abnormality ? ?I did explain all of the lab findings and imaging along with EKG findings to the patient.  He is feeling much better since the second DuoNeb.  I do feel this is less likely to be a MI and definitely not CHF.  Did not repeat a COVID test as he had a negative test few days ago.  And his symptoms have been ongoing for more than a week.  Patient was given a prescription of amoxicillin, Medrol Dosepak, albuterol inhaler, and Tussionex cough syrup for evening times.  Patient is in agreement with this treatment plan.  He was given strict instructions to return if worsening.  He was discharged stable condition. ? ? ? ? ?  ? ? ?FINAL CLINICAL IMPRESSION(S) / ED DIAGNOSES  ? ?Final diagnoses:  ?Acute bronchitis, unspecified organism  ? ? ? ?Rx / DC Orders  ? ?ED Discharge Orders   ? ?      Ordered  ?  amoxicillin (AMOXIL) 875 MG tablet  2 times daily,   Status:  Discontinued       ?  04/09/22 1557  ?  methylPREDNISolone (MEDROL DOSEPAK) 4 MG TBPK tablet       ? 04/09/22 1557  ?  albuterol (VENTOLIN HFA) 108 (90 Base) MCG/ACT inhaler  Every 6 hours PRN       ? 04/09/22 1557  ?  chlorpheniramine-HYDROcodone (TUSSIONEX PENNKINETIC ER) 10-8 MG/5ML  At bedtime PRN       ? 04/09/22 1557  ?  amoxicillin (AMOXIL) 875 MG tablet  2 times daily       ? 04/09/22 1558  ? ?  ?  ? ?  ? ? ? ?Note:  This document was prepared using Dragon voice recognition software and may include unintentional dictation errors. ? ?  ?Faythe Ghee, PA-C ?04/09/22 1604 ? ?  ?Minna Antis, MD ?04/12/22 2112 ? ?

## 2022-05-01 ENCOUNTER — Emergency Department: Payer: 59

## 2022-05-01 ENCOUNTER — Emergency Department
Admission: EM | Admit: 2022-05-01 | Discharge: 2022-05-01 | Disposition: A | Discharge status: Home / Self Care | Payer: 59 | Attending: Emergency Medicine | Admitting: Emergency Medicine

## 2022-05-01 ENCOUNTER — Encounter: Payer: Self-pay | Admitting: Emergency Medicine

## 2022-05-01 ENCOUNTER — Other Ambulatory Visit: Payer: Self-pay

## 2022-05-01 DIAGNOSIS — J209 Acute bronchitis, unspecified: Secondary | ICD-10-CM | POA: Diagnosis not present

## 2022-05-01 DIAGNOSIS — J988 Other specified respiratory disorders: Secondary | ICD-10-CM | POA: Insufficient documentation

## 2022-05-01 DIAGNOSIS — R059 Cough, unspecified: Secondary | ICD-10-CM | POA: Diagnosis present

## 2022-05-01 DIAGNOSIS — I2510 Atherosclerotic heart disease of native coronary artery without angina pectoris: Secondary | ICD-10-CM | POA: Diagnosis not present

## 2022-05-01 DIAGNOSIS — J069 Acute upper respiratory infection, unspecified: Secondary | ICD-10-CM

## 2022-05-01 DIAGNOSIS — I10 Essential (primary) hypertension: Secondary | ICD-10-CM | POA: Insufficient documentation

## 2022-05-01 MED ORDER — TUSSIN COUGH+CHEST CONG DM SF 10-100 MG/5ML PO LIQD: 5.0000 mL | ORAL | 0 refills | Status: DC | PRN

## 2022-05-01 MED ORDER — AEROCHAMBER PLUS FLO-VU LARGE MISC
1.0000 | Freq: Once | Status: AC
  Administered 2022-05-01: 1
  Filled 2022-05-01: qty 1

## 2022-05-01 MED ORDER — IPRATROPIUM-ALBUTEROL 0.5-2.5 (3) MG/3ML IN SOLN
6.0000 mL | Freq: Once | RESPIRATORY_TRACT | Status: AC
  Administered 2022-05-01: 6 mL via RESPIRATORY_TRACT
  Filled 2022-05-01: qty 6

## 2022-05-01 MED ORDER — ACETAMINOPHEN 325 MG PO TABS
650.0000 mg | ORAL_TABLET | Freq: Once | ORAL | Status: AC | PRN
  Administered 2022-05-01: 650 mg via ORAL
  Filled 2022-05-01: qty 2

## 2022-05-01 MED ORDER — DOXYCYCLINE HYCLATE 50 MG PO CAPS
100.0000 mg | ORAL_CAPSULE | Freq: Two times a day (BID) | ORAL | 0 refills | Status: AC
Start: 2022-05-01 — End: 2022-05-06

## 2022-05-01 MED ORDER — DOXYCYCLINE HYCLATE 100 MG PO TABS
100.0000 mg | ORAL_TABLET | Freq: Once | ORAL | Status: AC
Start: 2022-05-01 — End: 2022-05-01
  Administered 2022-05-01: 100 mg via ORAL
  Filled 2022-05-01: qty 1

## 2022-05-01 NOTE — ED Triage Notes (Signed)
Pt to ED from home c/o non productive cough, diffuse chest pain associated with coughing for the last week.  Pt had been prescribed prednisone and inhaler for home use, some relief with inhaler.  States wheezing at home.  Used home COVID test that was negative.  Pt is speaking in full and complete sentences, no audible wheeze noted, chest rise even and unlabored, skin WNL and in NAD at this time.

## 2022-05-01 NOTE — ED Provider Notes (Signed)
,  [facility] Provider Note   Event Date/Time   First MD Initiated Contact with Patient 05/01/22 1753     (approximate) History  Cough and Nasal Congestion (/)  HPI Dylan Holmes is a 56 y.o. male with a stated past medical history of hypertension and CAD who presents for nonproductive cough, diffuse chest pain and wheezing for the last 7 days.  Patient states that he was seenRecently for similar symptoms and prescribed prednisone and an albuterol inhaler however symptoms have not resolved.  Patient describes the chest pain as diffuse, anterior, and worse with deep inspiration or coughing.  Patient describes intensity as 8/10.  Patient has tried medication for chest congestion at home but does not remember what it was.  Patient currently denies any vision changes, tinnitus, difficulty speaking, facial droop, sore throat, abdominal pain, nausea/vomiting/diarrhea, dysuria, or weakness/numbness/paresthesias in any extremity Physical Exam  Triage Vital Signs: ED Triage Vitals  Enc Vitals Group     BP 05/01/22 1730 (!) 184/98     Pulse Rate 05/01/22 1730 77     Resp 05/01/22 1730 19     Temp 05/01/22 1730 98.9 F (37.2 C)     Temp Source 05/01/22 1730 Oral     SpO2 05/01/22 1730 93 %     Weight 05/01/22 1736 216 lb (98 kg)     Height 05/01/22 1736 5\' 10"  (1.778 m)     Head Circumference --      Peak Flow --      Pain Score 05/01/22 1736 8     Pain Loc --      Pain Edu? --      Excl. in GC? --    Most recent vital signs: Vitals:   05/01/22 1730  BP: (!) 184/98  Pulse: 77  Resp: 19  Temp: 98.9 F (37.2 C)  SpO2: 93%   General: Awake, oriented x4. CV:  Good peripheral perfusion.  Resp:  Normal effort.  Inspiratory and expiratory wheezes over bilateral lung fields Abd:  No distention.  Other:  Middle-aged African-American overweight male laying in bed in no acute distress ED Results / Procedures / Treatments   RADIOLOGY ED MD interpretation: 2 view  chest x-ray interpreted by me and shows central airway thickening consistent with bronchitis -Agree with radiology assessment Official radiology report(s): DG Chest 2 View  Result Date: 05/01/2022 CLINICAL DATA:  Diffuse chest pain associated with nonproductive cough. EXAM: CHEST - 2 VIEW COMPARISON:  Chest radiograph Apr 09, 2022 FINDINGS: The heart size and mediastinal contours are within normal limits. Similar central airway thickening. No focal airspace consolidation. No pleural effusion. No pneumothorax. The visualized skeletal structures are unchanged. IMPRESSION: Similar central airway thickening, consistent with bronchitis. No focal airspace consolidation. Electronically Signed   By: Apr 11, 2022 M.D.   On: 05/01/2022 18:14   PROCEDURES: Critical Care performed: No Procedures MEDICATIONS ORDERED IN ED: Medications  acetaminophen (TYLENOL) tablet 650 mg (650 mg Oral Given 05/01/22 1741)  ipratropium-albuterol (DUONEB) 0.5-2.5 (3) MG/3ML nebulizer solution 6 mL (6 mLs Nebulization Given 05/01/22 1841)  AeroChamber Plus Flo-Vu Large MISC 1 each (1 each Other Given 05/01/22 1904)  doxycycline (VIBRA-TABS) tablet 100 mg (100 mg Oral Given 05/01/22 1937)   IMPRESSION / MDM / ASSESSMENT AND PLAN / ED COURSE  I reviewed the triage vital signs and the nursing notes.  Otherwise healthy patient presenting with constellation of symptoms likely representing uncomplicated upper respiratory symptoms as characterized by mild pharyngitis  Unlikely PTA/RPA: no hot potato voice, no uvular deviation, Unlikely Esophageal rupture: No history of dysphagia Unlikely deep space infection/Ludwigs Low suspicion for CNS infection bacterial sinusitis, or pneumonia given exam and history.  Unlikely Strep or EBV as centor negative and with no pharyngeal exudate, posterior LAD, or splenomegaly.  Given duration of symptoms, will treat empirically with doxycycline.  Patient is feeling  much better and respiratory exam significantly improved after nebulizer treatment.  Patient given AeroChamber for his albuterol inhaler and taught how to use it at home.. No respiratory distress, otherwise relatively well appearing and nontoxic. Will discuss prompt follow up with PMD and strict return precautions. Dispo: Discharge home with PCP follow-up    FINAL CLINICAL IMPRESSION(S) / ED DIAGNOSES   Final diagnoses:  Acute upper respiratory infection  Acute bronchitis with wheezing   Rx / DC Orders   ED Discharge Orders          Ordered    doxycycline (VIBRAMYCIN) 50 MG capsule  2 times daily        05/01/22 1938    dextromethorphan-guaiFENesin (TUSSIN COUGH+CHEST CONG DM SF) 10-100 MG/5ML liquid  Every 4 hours PRN        05/01/22 1938           Note:  This document was prepared using Dragon voice recognition software and may include unintentional dictation errors.   Merwyn Katos, MD 05/01/22 [phone]

## 2022-05-08 ENCOUNTER — Other Ambulatory Visit: Payer: Self-pay

## 2022-05-08 ENCOUNTER — Emergency Department
Admission: EM | Admit: 2022-05-08 | Discharge: 2022-05-08 | Disposition: A | Discharge status: Home / Self Care | Payer: 59 | Attending: Emergency Medicine | Admitting: Emergency Medicine

## 2022-05-08 ENCOUNTER — Emergency Department: Payer: 59

## 2022-05-08 DIAGNOSIS — R197 Diarrhea, unspecified: Secondary | ICD-10-CM | POA: Insufficient documentation

## 2022-05-08 DIAGNOSIS — F17200 Nicotine dependence, unspecified, uncomplicated: Secondary | ICD-10-CM | POA: Insufficient documentation

## 2022-05-08 DIAGNOSIS — R0602 Shortness of breath: Secondary | ICD-10-CM | POA: Diagnosis present

## 2022-05-08 DIAGNOSIS — J069 Acute upper respiratory infection, unspecified: Secondary | ICD-10-CM | POA: Diagnosis not present

## 2022-05-08 DIAGNOSIS — I10 Essential (primary) hypertension: Secondary | ICD-10-CM | POA: Insufficient documentation

## 2022-05-08 DIAGNOSIS — R079 Chest pain, unspecified: Secondary | ICD-10-CM

## 2022-05-08 DIAGNOSIS — J209 Acute bronchitis, unspecified: Secondary | ICD-10-CM | POA: Diagnosis not present

## 2022-05-08 LAB — CBC
HCT: 43.7 % (ref 39.0–52.0)
Hemoglobin: 14.4 g/dL (ref 13.0–17.0)
MCH: 28.4 pg (ref 26.0–34.0)
MCHC: 33 g/dL (ref 30.0–36.0)
MCV: 86.2 fL (ref 80.0–100.0)
Platelets: 243 10*3/uL (ref 150–400)
RBC: 5.07 MIL/uL (ref 4.22–5.81)
RDW: 12.3 % (ref 11.5–15.5)
WBC: 5.8 10*3/uL (ref 4.0–10.5)
nRBC: 0 % (ref 0.0–0.2)

## 2022-05-08 LAB — BASIC METABOLIC PANEL
Anion gap: 10 (ref 5–15)
BUN: 11 mg/dL (ref 6–20)
CO2: 24 mmol/L (ref 22–32)
Calcium: 9.2 mg/dL (ref 8.9–10.3)
Chloride: 104 mmol/L (ref 98–111)
Creatinine, Ser: 0.9 mg/dL (ref 0.61–1.24)
GFR, Estimated: >60 mL/min (ref >60)
Glucose, Bld: 123 mg/dL — ABNORMAL HIGH (ref 70–99)
Potassium: 3.9 mmol/L (ref 3.5–5.1)
Sodium: 138 mmol/L (ref 135–145)

## 2022-05-08 LAB — TROPONIN I (HIGH SENSITIVITY): Troponin I (High Sensitivity): 11 ng/L (ref <18)

## 2022-05-08 MED ORDER — HYDROCOD POLI-CHLORPHE POLI ER 10-8 MG/5ML PO SUER
5.0000 mL | Freq: Once | ORAL | Status: AC
  Administered 2022-05-08: 5 mL via ORAL
  Filled 2022-05-08: qty 5

## 2022-05-08 MED ORDER — PREDNISONE 20 MG PO TABS
60.0000 mg | ORAL_TABLET | Freq: Once | ORAL | Status: AC
  Administered 2022-05-08: 60 mg via ORAL
  Filled 2022-05-08: qty 3

## 2022-05-08 MED ORDER — AZITHROMYCIN 250 MG PO TABS: ORAL_TABLET | ORAL | 0 refills | Status: AC

## 2022-05-08 MED ORDER — ALBUTEROL SULFATE HFA 108 (90 BASE) MCG/ACT IN AERS: 2.0000 | INHALATION_SPRAY | Freq: Four times a day (QID) | RESPIRATORY_TRACT | 2 refills | Status: DC | PRN

## 2022-05-08 MED ORDER — GUAIFENESIN-CODEINE 100-10 MG/5ML PO SOLN: 5.0000 mL | Freq: Four times a day (QID) | ORAL | 0 refills | Status: DC | PRN

## 2022-05-08 MED ORDER — IPRATROPIUM-ALBUTEROL 0.5-2.5 (3) MG/3ML IN SOLN
3.0000 mL | Freq: Once | RESPIRATORY_TRACT | Status: AC
  Administered 2022-05-08: 3 mL via RESPIRATORY_TRACT
  Filled 2022-05-08: qty 3

## 2022-05-08 MED ORDER — PREDNISONE 10 MG PO TABS: 10.0000 mg | ORAL_TABLET | Freq: Every day | ORAL | 0 refills | Status: DC

## 2022-05-08 NOTE — ED Notes (Signed)
Provider at bedside

## 2022-05-08 NOTE — ED Notes (Signed)
Pt to ED for URI, pain in chest with deep breathing, has been sick about 2 weeks with feeling worse last 2 days.

## 2022-05-08 NOTE — ED Provider Notes (Signed)
[facility] Provider Note    Event Date/Time   First MD Initiated Contact with Patient 05/08/22 [phone]     (approximate)  History   Chief Complaint: Chest Pain and Shortness of Breath  HPI  Yardley Lekas is a 56 y.o. male with a past medical history of hypertension presents to the emergency department for cough shortness of breath and chest discomfort.  According to the patient over the past couple days the patient has been experiencing worsening cough with shortness of breath.  States slight headache and diarrhea at times.  Patient is a daily smoker.  Patient denies any known fever.  Physical Exam   Triage Vital Signs: ED Triage Vitals  Enc Vitals Group     BP 05/08/22 0712 (!) 169/92     Pulse Rate 05/08/22 0712 79     Resp 05/08/22 0712 18     Temp 05/08/22 0712 98.7 F (37.1 C)     Temp Source 05/08/22 0712 Oral     SpO2 05/08/22 0712 100 %     Weight 05/08/22 0704 220 lb (99.8 kg)     Height 05/08/22 0704 5\' 10"  (1.778 m)     Head Circumference --      Peak Flow --      Pain Score 05/08/22 0704 10     Pain Loc --      Pain Edu? --      Excl. in GC? --     Most recent vital signs: Vitals:   05/08/22 0712  BP: (!) 169/92  Pulse: 79  Resp: 18  Temp: 98.7 F (37.1 C)  SpO2: 100%    General: Awake, no distress.  CV:  Good peripheral perfusion.  Regular rate and rhythm  Resp:  Normal effort.  Equal breath sounds bilaterally.  Mild wheeze bilaterally. Abd:  No distention.  Soft, nontender.  No rebound or guarding. Other:  No lower extremity edema or tenderness   ED Results / Procedures / Treatments   EKG  EKG viewed and interpreted by myself shows a normal sinus rhythm at 78 bpm with a narrow QRS, normal axis, normal intervals, nonspecific ST changes without ST elevation.  RADIOLOGY  I have interpreted the x-ray images no acute abnormality seen on my evaluation. Radiology is read likely acute bronchitis   MEDICATIONS ORDERED IN  ED: Medications  predniSONE (DELTASONE) tablet 60 mg (has no administration in time range)  ipratropium-albuterol (DUONEB) 0.5-2.5 (3) MG/3ML nebulizer solution 3 mL (has no administration in time range)  ipratropium-albuterol (DUONEB) 0.5-2.5 (3) MG/3ML nebulizer solution 3 mL (has no administration in time range)  chlorpheniramine-HYDROcodone 10-8 MG/5ML suspension 5 mL (has no administration in time range)     IMPRESSION / MDM / ASSESSMENT AND PLAN / ED COURSE  I reviewed the triage vital signs and the nursing notes.  Patient presents to the emergency department for central chest discomfort cough and shortness of breath.  States that chest discomfort is mostly when he is coughing.  Patient does have expiratory wheeze bilaterally patient is a daily smoker.  Differential would include pneumonia, bronchitis, URI exacerbating possible underlying COPD, less likely ACS.  We will check labs including cardiac enzymes we will obtain an EKG chest x-ray.  We will treat with prednisone and DuoNebs while awaiting results.  Patient agreeable to plan.  Lab work is reassuring including normal CBC reassuring chemistry and a negative troponin.  Chest x-ray consistent with acute bronchitis which fits the patient's clinical presentation.  We  will discharge patient on a taper of steroids, cough medication and antibiotic.  Patient agreeable to plan of care.  FINAL CLINICAL IMPRESSION(S) / ED DIAGNOSES   Dyspnea Upper respiratory infection Acute bronchitis Note:  This document was prepared using Dragon voice recognition software and may include unintentional dictation errors.   Minna Antis, MD 05/08/22 [phone]

## 2022-05-08 NOTE — ED Triage Notes (Signed)
Pt c/o chest pain with SOB since yesterday, pt c/o HA and has had some diarrhea. Denies vomiting or diaphoresis

## 2022-05-28 ENCOUNTER — Encounter: Payer: Self-pay | Admitting: Emergency Medicine

## 2022-05-28 ENCOUNTER — Other Ambulatory Visit: Payer: Self-pay

## 2022-05-28 ENCOUNTER — Emergency Department
Admission: EM | Admit: 2022-05-28 | Discharge: 2022-05-28 | Disposition: A | Discharge status: Home / Self Care | Payer: 59 | Attending: Emergency Medicine | Admitting: Emergency Medicine

## 2022-05-28 ENCOUNTER — Emergency Department: Payer: 59

## 2022-05-28 DIAGNOSIS — Z87891 Personal history of nicotine dependence: Secondary | ICD-10-CM | POA: Insufficient documentation

## 2022-05-28 DIAGNOSIS — I252 Old myocardial infarction: Secondary | ICD-10-CM | POA: Diagnosis not present

## 2022-05-28 DIAGNOSIS — J40 Bronchitis, not specified as acute or chronic: Secondary | ICD-10-CM | POA: Insufficient documentation

## 2022-05-28 DIAGNOSIS — I10 Essential (primary) hypertension: Secondary | ICD-10-CM | POA: Insufficient documentation

## 2022-05-28 DIAGNOSIS — R059 Cough, unspecified: Secondary | ICD-10-CM | POA: Diagnosis present

## 2022-05-28 MED ORDER — ALBUTEROL SULFATE HFA 108 (90 BASE) MCG/ACT IN AERS: 2.0000 | INHALATION_SPRAY | Freq: Four times a day (QID) | RESPIRATORY_TRACT | 2 refills | Status: DC | PRN

## 2022-05-28 MED ORDER — IBUPROFEN 600 MG PO TABS
600.0000 mg | ORAL_TABLET | Freq: Once | ORAL | Status: AC
Start: 2022-05-28 — End: 2022-05-28
  Administered 2022-05-28: 600 mg via ORAL
  Filled 2022-05-28: qty 1

## 2022-05-28 MED ORDER — PREDNISONE 10 MG PO TABS: ORAL_TABLET | ORAL | 0 refills | Status: DC

## 2022-05-28 MED ORDER — OXYCODONE HCL 5 MG PO TABS: 5.0000 mg | ORAL_TABLET | Freq: Four times a day (QID) | ORAL | 0 refills | Status: AC | PRN

## 2022-05-28 NOTE — ED Provider Notes (Signed)
[facility] Provider Note    Event Date/Time   First MD Initiated Contact with Patient 05/28/22 0732     (approximate)   History   Back Pain and Cough   HPI  Dylan Holmes is a 56 y.o. male   presents to the ED with complaint of productive cough for approximately 1 month without any relief of over-the-counter medication.  He denies any fever, chills, nausea or vomiting.  He is unaware of any known exposure to COVID or influenza.  Patient has been seen in the emergency department 3 times in the month of May for similar symptoms and states that it just returns.  Patient denies any previous history of asthma or bronchitis.  Patient has a history of hypertension, non-STEMI and unstable angina.  Up until recently he was smoking 1 pack of cigarettes per day but states that he is unable to smoke as it increases the cough and makes him more short of breath.      Physical Exam   Triage Vital Signs: ED Triage Vitals  Enc Vitals Group     BP 05/28/22 0718 (!) 172/105     Pulse Rate 05/28/22 0718 76     Resp 05/28/22 0718 16     Temp 05/28/22 0718 98.7 F (37.1 C)     Temp Source 05/28/22 0718 Oral     SpO2 05/28/22 0718 92 %     Weight 05/28/22 0719 219 lb 12.8 oz (99.7 kg)     Height 05/28/22 0719 5\' 10"  (1.778 m)     Head Circumference --      Peak Flow --      Pain Score 05/28/22 0719 8     Pain Loc --      Pain Edu? --      Excl. in GC? --     Most recent vital signs: Vitals:   05/28/22 0743 05/28/22 0849  BP: (!) 168/99 (!) 160/90  Pulse: 78 80  Resp: 16 (!) 1  Temp:    SpO2: 94% 95%     General: Awake, no distress.  Able to speak in complete sentences without any shortness of breath. CV:  Good peripheral perfusion.  Regular rate and rhythm. Resp:  Normal effort.  Lungs without rales or rhonchi.  No wheezes are present at this time. Abd:  No distention.  Other:  No obvious nasal congestion, no posterior drainage.   ED Results /  Procedures / Treatments   Labs (all labs ordered are listed, but only abnormal results are displayed) Labs Reviewed - No data to display   EKG  Normal sinus rhythm with a ventricular rate of 72, PR interval 152 QRS 70 QT/QTc 394/431   RADIOLOGY  Chest x-ray images were reviewed and interpreted by myself independent of the radiologist.  Bronchitic changes but no infiltrate noted.  Social radiology report is negative for consolidation and shows mild bronchitic changes.   PROCEDURES:  Critical Care performed:   Procedures   MEDICATIONS ORDERED IN ED: Medications  ibuprofen (ADVIL) tablet 600 mg (600 mg Oral Given 05/28/22 0848)     IMPRESSION / MDM / ASSESSMENT AND PLAN / ED COURSE  I reviewed the triage vital signs and the nursing notes.   Differential diagnosis includes, but is not limited to, viral upper respiratory infection with cough, bronchitis, pneumonia, seasonal allergies.  History of smoking.  56 year old male presents to the ED with continued complaint of cough and now complains of back pain secondary to  his cough.  Patient has been seen 4 times since May 1 for this same complaint and states that cough medication has not helped.  He has been told he has bronchitis but intermittently uses the inhaler.  Patient currently does not have a PCP.  He states he has never been told that he has COPD due to his smoking.  EKG compared to previous EKGs is unchanged.  Chest x-ray reviewed and no pneumonia however radiology report suggest mild bronchitis today.  I reviewed previous labs from 04/09/22 and 05/08/2022 which were all within normal limits and troponin on both days was less than 18.  Patient is afebrile and O2 sat 95%.  Patient is able to talk in complete sentences without any shortness of breath.  We discussed discontinuing smoking altogether.  It was explained to him that the albuterol inhaler needs to be used every 6 hours which will also help with his coughing.  Patient is  strongly encouraged to obtain a PCP to manage his hypertension and other health issues.  There is probably some underlying COPD along with his present illness.  Patient was started on prednisone today, given another prescription for albuterol inhaler and a prescription for oxycodone 5 mg immediate release as his pharmacy is [facility].  A list of clinics was written on his discharge papers to call to see if they are taking any new patients and also the name of the pulmonologist that is on-call was also listed on his discharge papers to call make an appointment.      Patient's presentation is most consistent with acute complicated illness / injury requiring diagnostic workup.  FINAL CLINICAL IMPRESSION(S) / ED DIAGNOSES   Final diagnoses:  Bronchitis  History of cigarette smoking  Primary hypertension     Rx / DC Orders   ED Discharge Orders          Ordered    albuterol (VENTOLIN HFA) 108 (90 Base) MCG/ACT inhaler  Every 6 hours PRN        05/28/22 0812    predniSONE (DELTASONE) 10 MG tablet  Status:  Discontinued        05/28/22 0812    predniSONE (DELTASONE) 10 MG tablet        05/28/22 0813    oxyCODONE (OXY IR/ROXICODONE) 5 MG immediate release tablet  Every 6 hours PRN        05/28/22 0844             Note:  This document was prepared using Dragon voice recognition software and may include unintentional dictation errors.   Tommi Rumps, PA-C 05/28/22 1121    Jene Every, MD 05/28/22 1505

## 2022-05-28 NOTE — Discharge Instructions (Addendum)
Call the clinics listed on your discharge papers to obtain a primary care provider which she should be seeing especially with your history of hypertension for better control and continued daily hypertensive medication.  One of the clinics listed on your discharge papers is actually in [place] and the other is in [place]. Dr. Karna Christmas is the pulmonologist that is on-call and is located at [facility].  Make an appointment in his office for further evaluation of your bronchitis.  2 prescriptions were sent to the pharmacy 1 is to have continued refills on your Ventolin inhaler that you should be using every 6 hours and a prescription for prednisone to take daily for next 7 days.  Discontinue smoking cigarettes as this will cause increased inflammation in your bronchial tubes and increase your coughing.

## 2022-05-28 NOTE — ED Triage Notes (Signed)
Cough causing pain upper mid back.  Productive cough.  No fever

## 2022-09-20 IMAGING — CR DG CHEST 2V
1 series · 2 of 2 positions shown · non-contrast
Comparison: Chest x-ray 05/01/2022.

CLINICAL DATA: 55-year-old male with history of chest pain and
pressure. Shortness of breath.

EXAM:
CHEST - 2 VIEW

[Series 1: dg chest 2 view · 0.14mm/px · 2 of 2 slices shown]
[im 1/2]
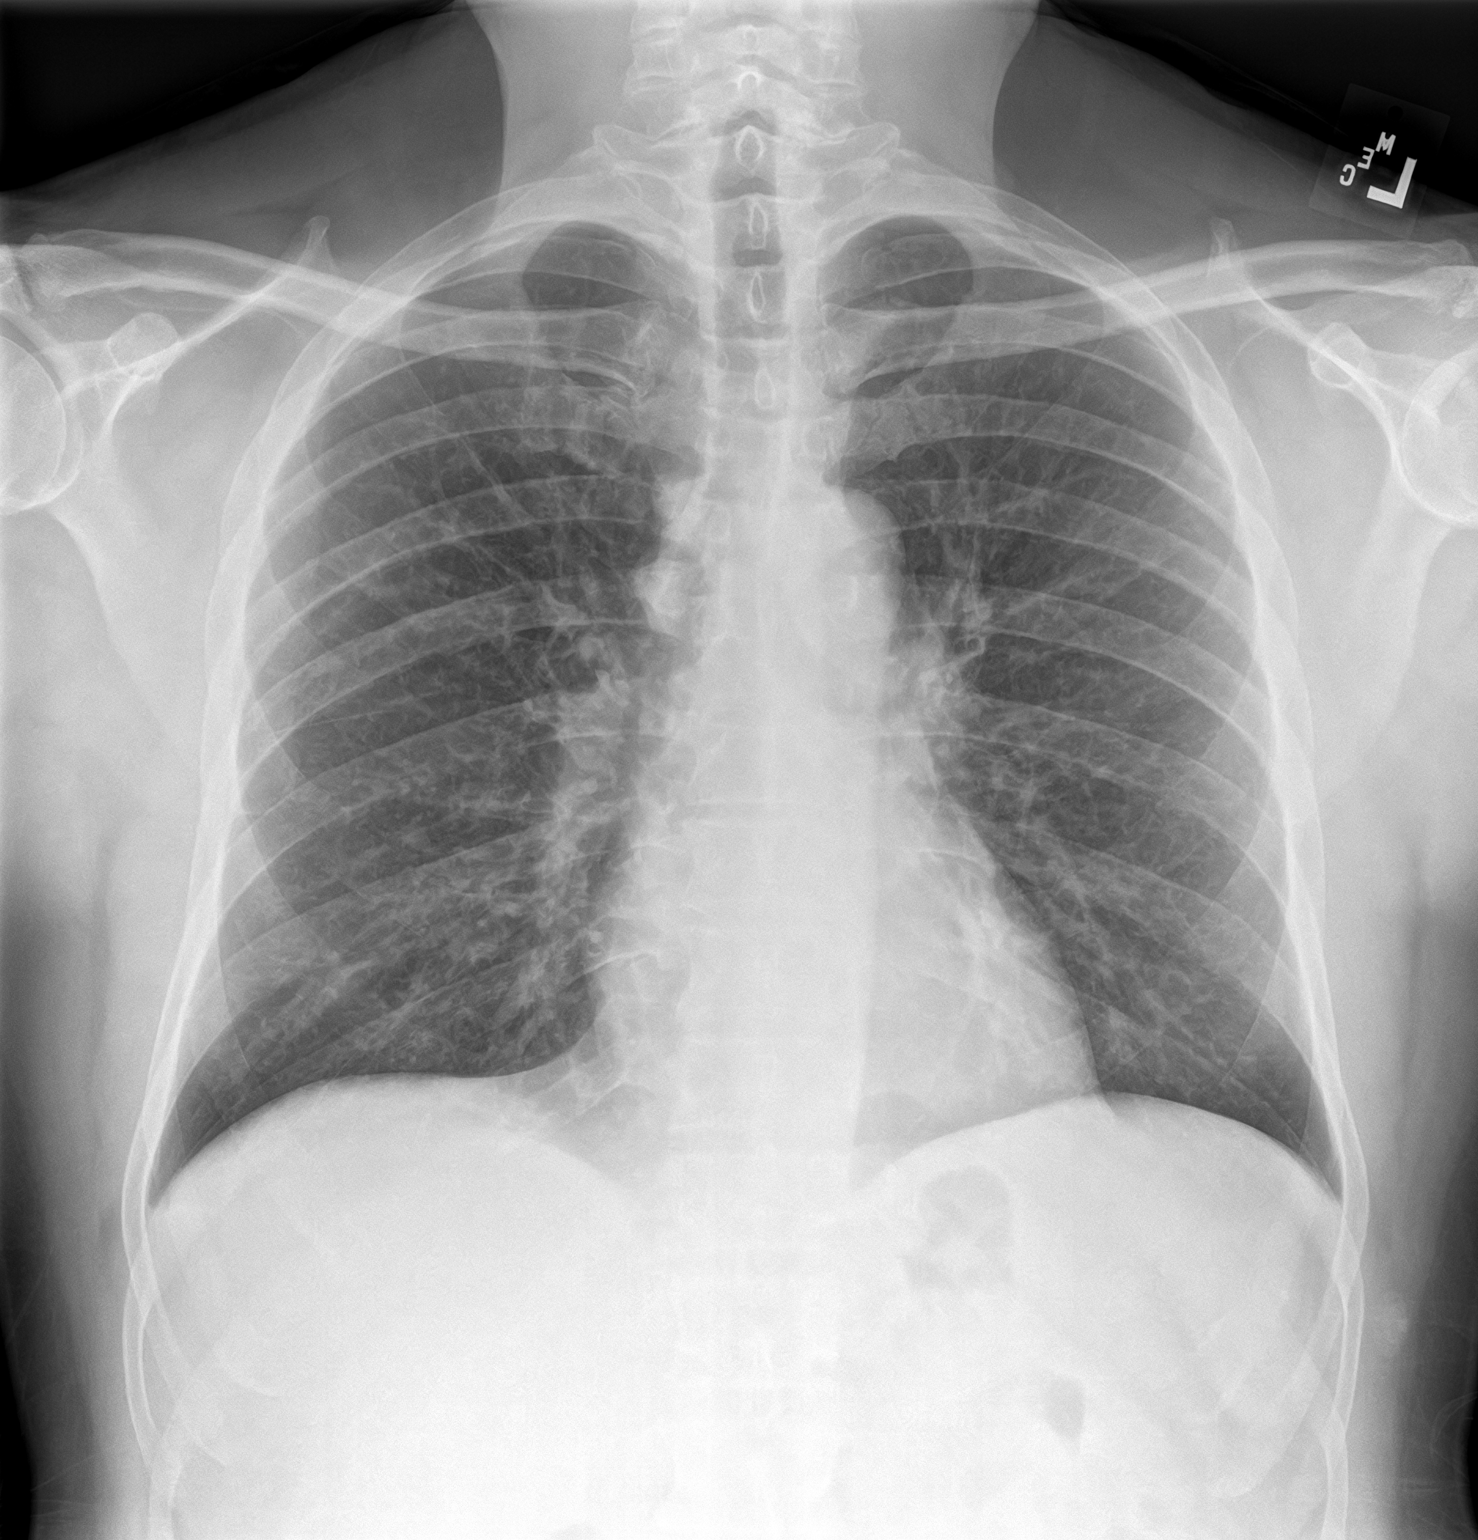
[im 2/2]
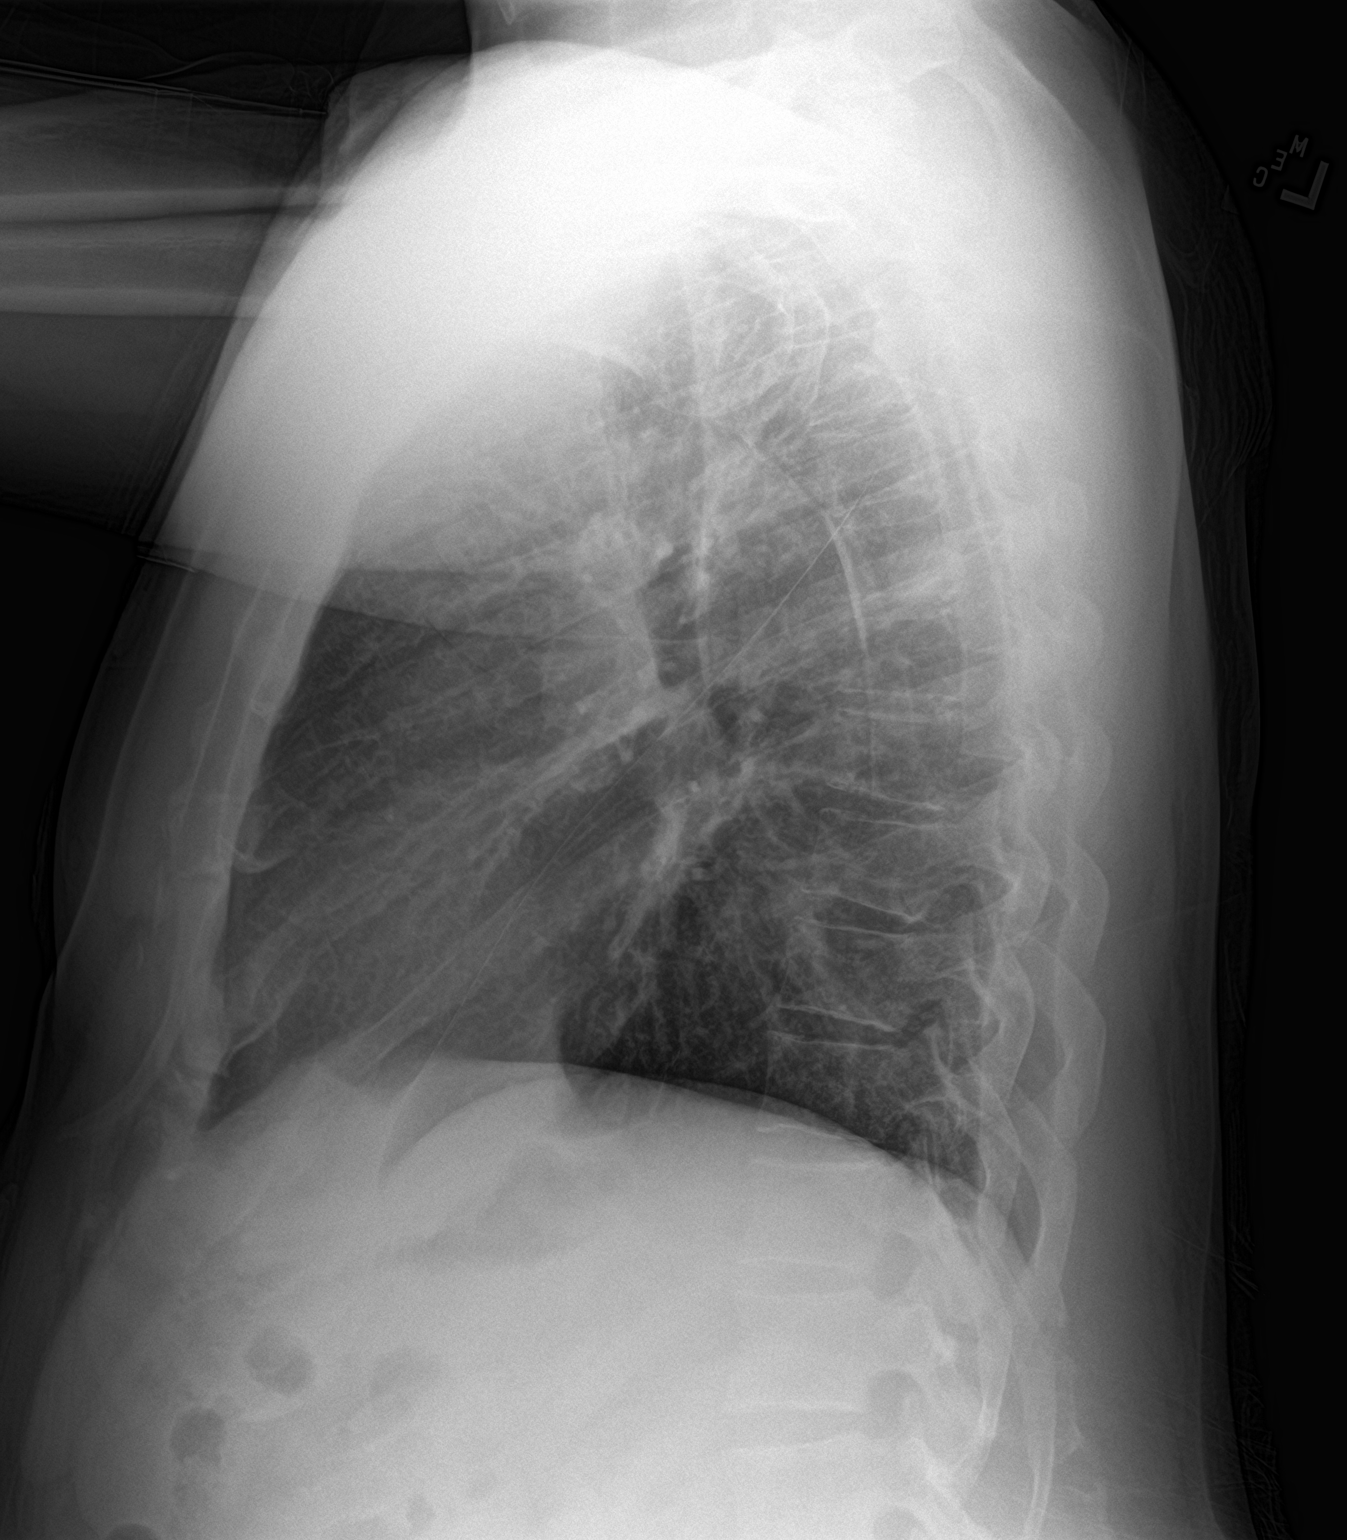

[2 of 2 positions shown; findings below may reference images not displayed]

FINDINGS: Mild diffuse peribronchial cuffing and interstitial prominence. Lung
volumes are normal. No consolidative airspace disease. No pleural
effusions. No pneumothorax. No pulmonary nodule or mass noted.
Pulmonary vasculature and the cardiomediastinal silhouette are
within normal limits. Atherosclerosis in the thoracic aorta.
IMPRESSION: 1. Mild diffuse peribronchial cuffing and interstitial prominence
concerning for an acute bronchitis.
2. Aortic atherosclerosis.

## 2022-10-10 IMAGING — CR DG CHEST 2V
1 series · 2 of 2 positions shown · non-contrast
Comparison: 05/08/2022

CLINICAL DATA: Persistent productive cough for few weeks. UPPER
chest and back pain.

EXAM:
CHEST - 2 VIEW

[Series 1: dg chest 2 view · 0.14mm/px · 2 of 2 slices shown]
[im 1/2]
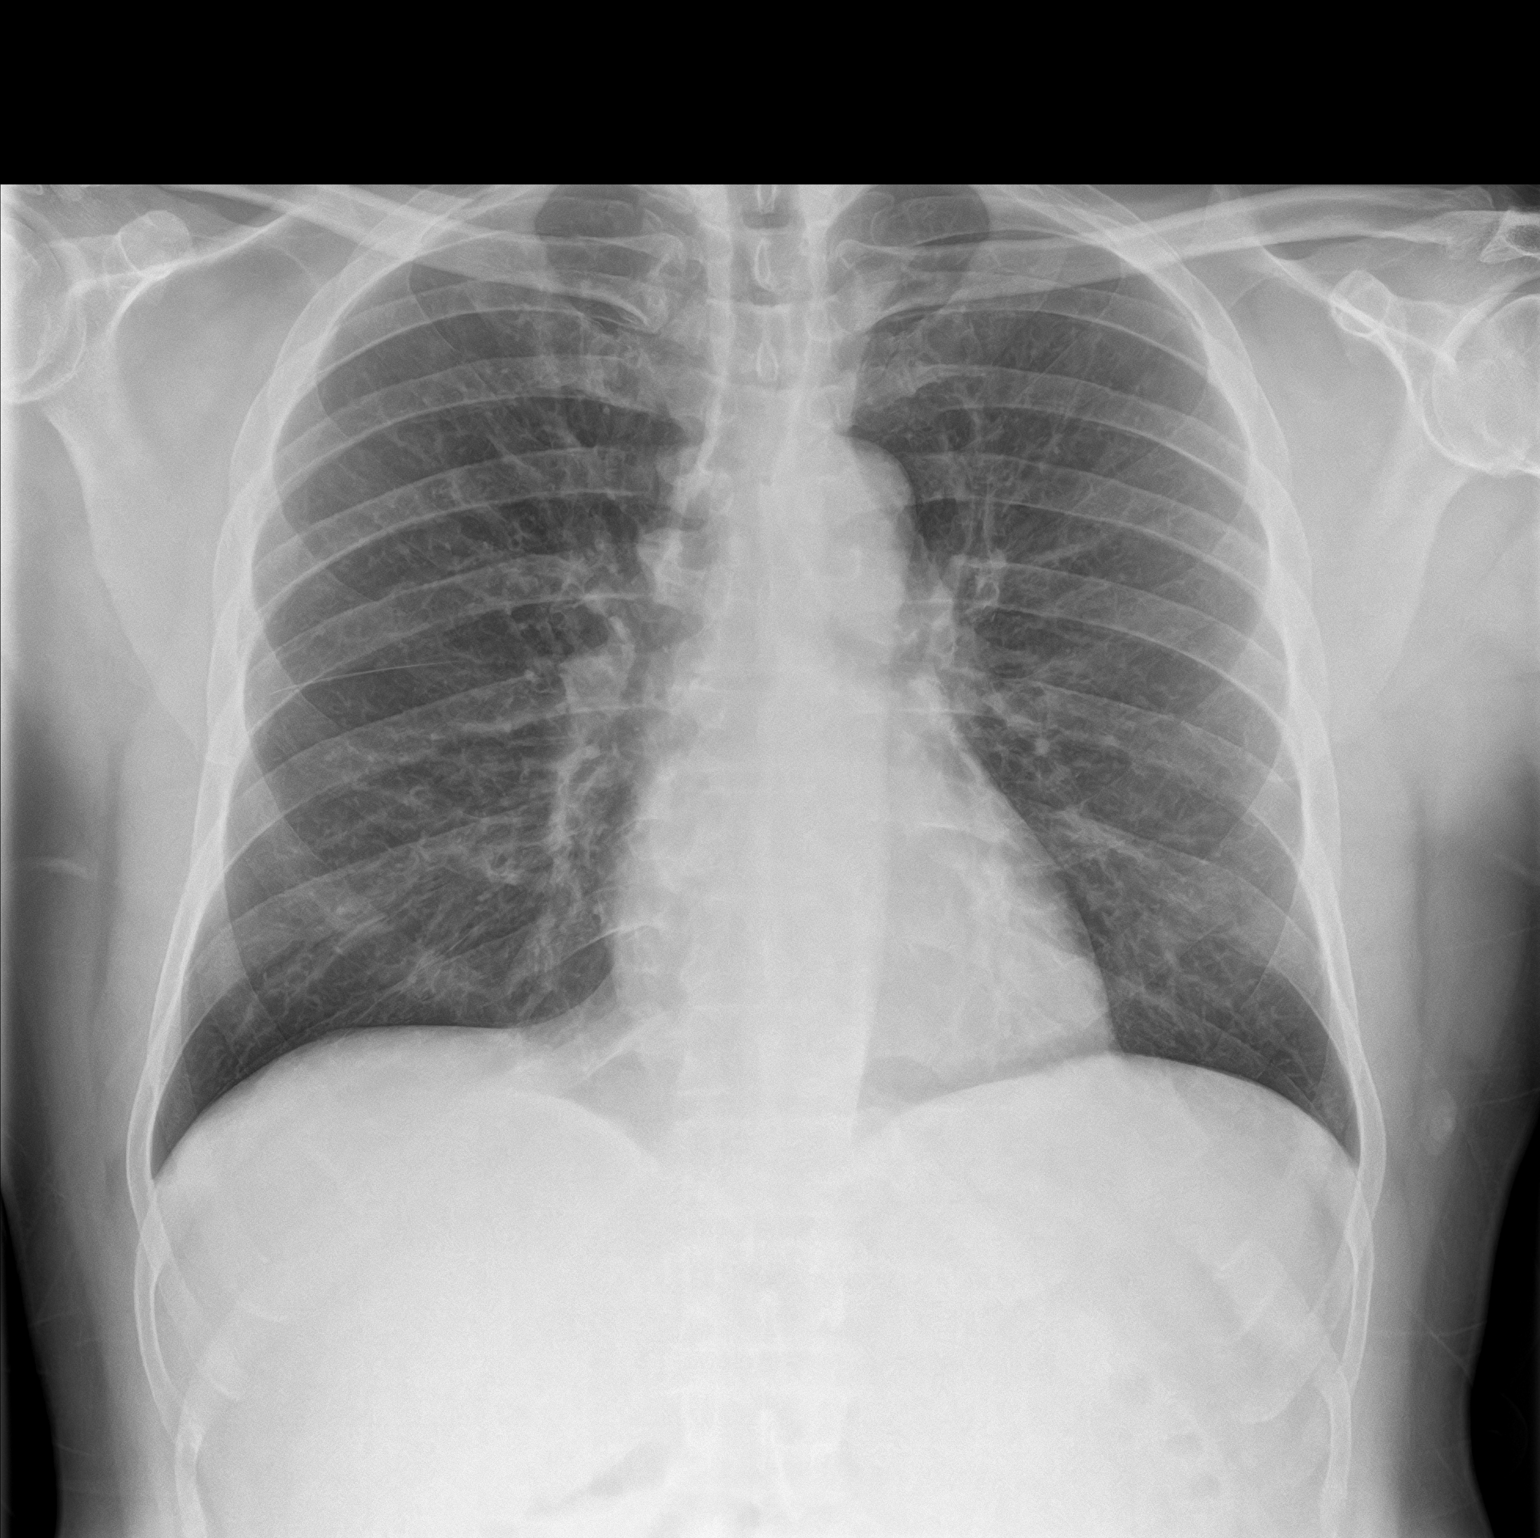
[im 2/2]
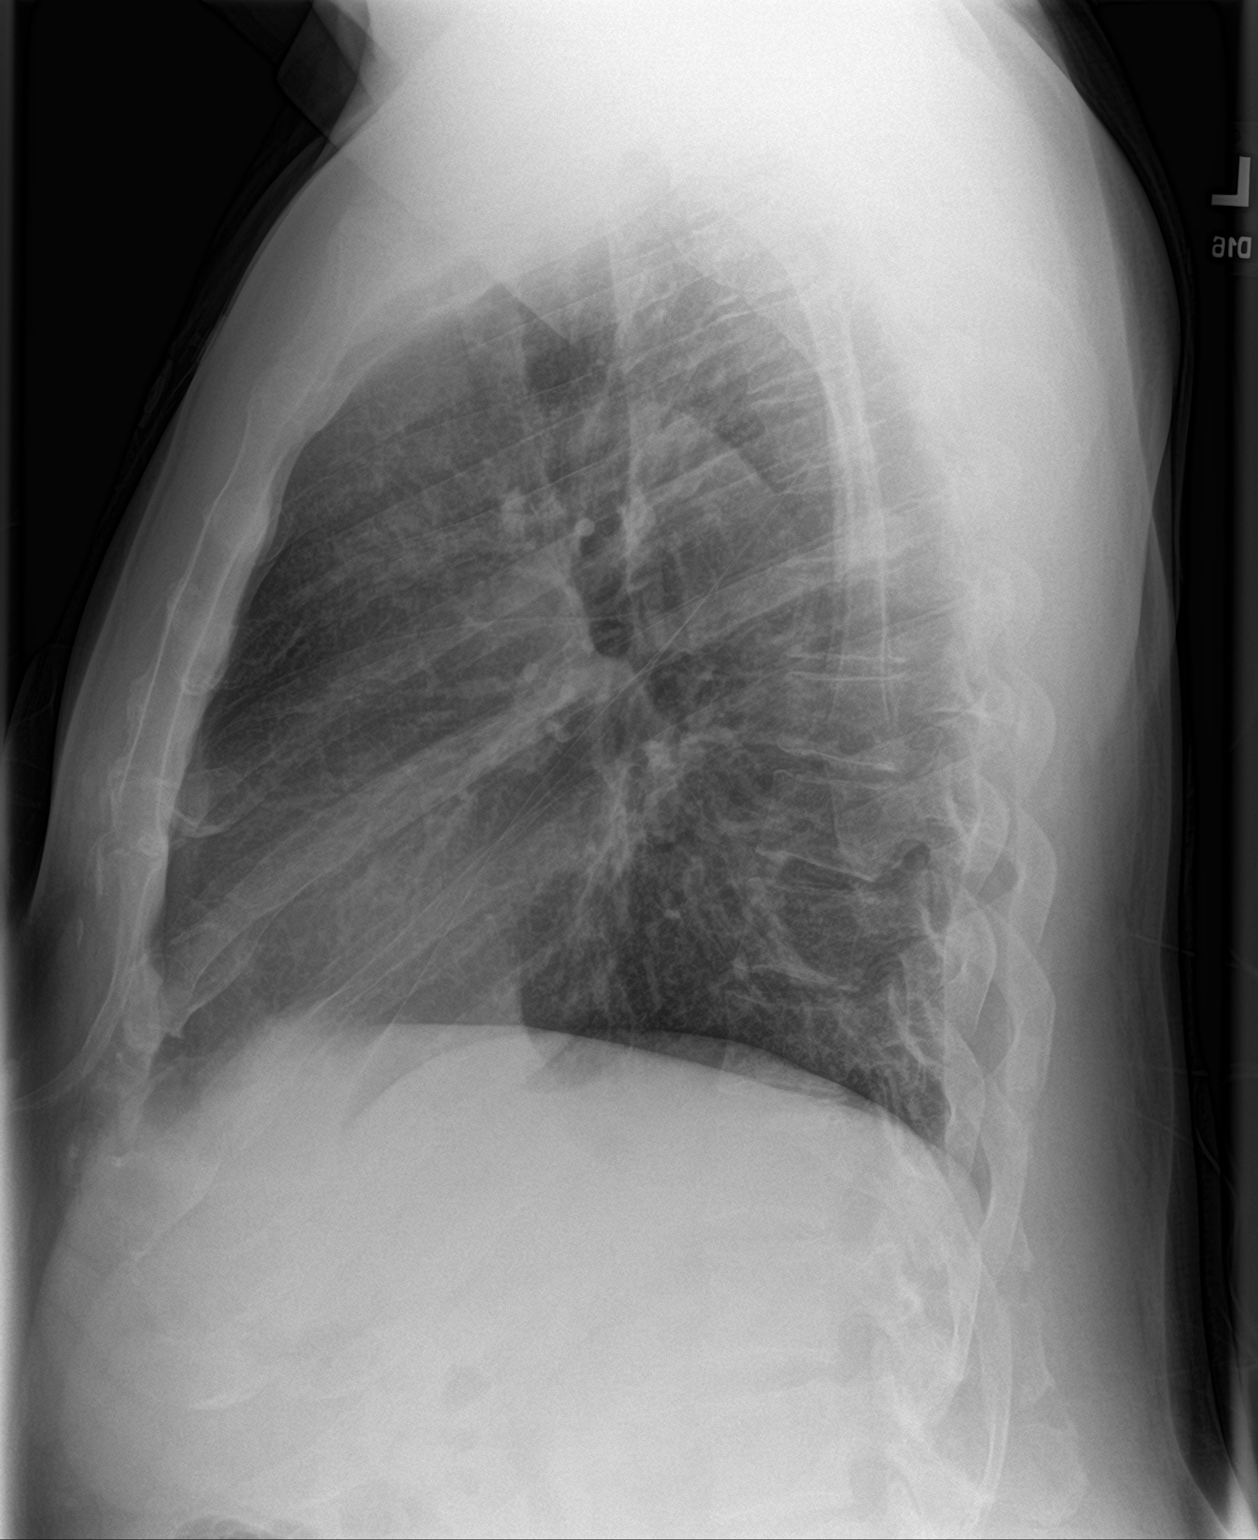

[2 of 2 positions shown; findings below may reference images not displayed]

FINDINGS: There is mild diffuse perihilar peribronchial thickening. No focal
consolidations or pleural effusions. No pulmonary edema. Heart size
is normal. Visualized osseous structures have a normal appearance.
IMPRESSION: Mild bronchitic changes.  No acute pulmonary consolidation.

## 2024-06-02 ENCOUNTER — Emergency Department

## 2024-06-02 ENCOUNTER — Emergency Department
Admission: EM | Admit: 2024-06-02 | Discharge: 2024-06-02 | Disposition: A | Discharge status: Home / Self Care | Attending: Emergency Medicine | Admitting: Emergency Medicine

## 2024-06-02 ENCOUNTER — Other Ambulatory Visit: Payer: Self-pay

## 2024-06-02 ENCOUNTER — Encounter: Payer: Self-pay | Admitting: Intensive Care

## 2024-06-02 DIAGNOSIS — I10 Essential (primary) hypertension: Secondary | ICD-10-CM | POA: Diagnosis not present

## 2024-06-02 DIAGNOSIS — J069 Acute upper respiratory infection, unspecified: Secondary | ICD-10-CM | POA: Diagnosis not present

## 2024-06-02 DIAGNOSIS — R059 Cough, unspecified: Secondary | ICD-10-CM | POA: Diagnosis present

## 2024-06-02 LAB — GROUP A STREP BY PCR: Group A Strep by PCR: NOT DETECTED

## 2024-06-02 LAB — RESP PANEL BY RT-PCR (RSV, FLU A&B, COVID)  RVPGX2
Influenza A by PCR: NEGATIVE
Influenza B by PCR: NEGATIVE
Resp Syncytial Virus by PCR: NEGATIVE
SARS Coronavirus 2 by RT PCR: NEGATIVE

## 2024-06-02 MED ORDER — IPRATROPIUM-ALBUTEROL 0.5-2.5 (3) MG/3ML IN SOLN
3.0000 mL | Freq: Once | RESPIRATORY_TRACT | Status: AC
  Administered 2024-06-02: 3 mL via RESPIRATORY_TRACT
  Filled 2024-06-02: qty 3

## 2024-06-02 MED ORDER — PREDNISONE 10 MG PO TABS
50.0000 mg | ORAL_TABLET | Freq: Every day | ORAL | 0 refills | Status: AC
Start: 2024-06-02 — End: ?

## 2024-06-02 MED ORDER — KETOROLAC TROMETHAMINE 30 MG/ML IJ SOLN
15.0000 mg | Freq: Once | INTRAMUSCULAR | Status: AC
  Administered 2024-06-02: 15 mg via INTRAVENOUS
  Filled 2024-06-02: qty 1

## 2024-06-02 MED ORDER — ACETAMINOPHEN 500 MG PO TABS
1000.0000 mg | ORAL_TABLET | Freq: Once | ORAL | Status: AC
  Administered 2024-06-02: 1000 mg via ORAL
  Filled 2024-06-02: qty 2

## 2024-06-02 MED ORDER — METHYLPREDNISOLONE SODIUM SUCC 125 MG IJ SOLR
125.0000 mg | Freq: Once | INTRAMUSCULAR | Status: AC
Start: 2024-06-02 — End: 2024-06-02
  Administered 2024-06-02: 125 mg via INTRAVENOUS
  Filled 2024-06-02: qty 2

## 2024-06-02 MED ORDER — GUAIFENESIN-CODEINE 100-10 MG/5ML PO SOLN: 10.0000 mL | Freq: Three times a day (TID) | ORAL | 0 refills | Status: AC | PRN

## 2024-06-02 MED ORDER — ALBUTEROL SULFATE HFA 108 (90 BASE) MCG/ACT IN AERS: 2.0000 | INHALATION_SPRAY | RESPIRATORY_TRACT | 1 refills | Status: AC | PRN

## 2024-06-02 NOTE — ED Triage Notes (Signed)
 C/o sore throat, productive cough, discomfort in chest when coughing, and low grade fevers. Symptoms started yesterday

## 2024-06-02 NOTE — ED Provider Notes (Signed)
 [facility] Provider Note    Event Date/Time   First MD Initiated Contact with Patient 06/02/24 [phone]     (approximate)   History   Cough and Sore Throat   HPI  Dylan Holmes is a 58 y.o. male  with history of HTN, unstable angina, NSTEMI and as listed in EMR presents to the emergency department for treatment and evaluation of cough, wheezing, headache x 2 days. Sore throat was present in the beginning, but has resolved. No known fever, but has felt hot. No alleviating measures prior to arrival.      Physical Exam   Triage Vital Signs: ED Triage Vitals  Encounter Vitals Group     BP 06/02/24 0714 (!) 142/83     Girls Systolic BP Percentile --      Girls Diastolic BP Percentile --      Boys Systolic BP Percentile --      Boys Diastolic BP Percentile --      Pulse Rate 06/02/24 0714 (!) 103     Resp 06/02/24 0714 20     Temp 06/02/24 0714 99.2 F (37.3 C)     Temp Source 06/02/24 0714 Oral     SpO2 06/02/24 0714 93 %     Weight 06/02/24 0715 248 lb (112.5 kg)     Height 06/02/24 0715 5' 10.5 (1.791 m)     Head Circumference --      Peak Flow --      Pain Score 06/02/24 0715 8     Pain Loc --      Pain Education --      Exclude from Growth Chart --     Most recent vital signs: Vitals:   06/02/24 0714  BP: (!) 142/83  Pulse: (!) 103  Resp: 20  Temp: 99.2 F (37.3 C)  SpO2: 93%    General: Awake, no distress.  CV:  Good peripheral perfusion.  Resp:  Normal effort. Audible wheezing throughout Abd:  No distention. Soft Other:     ED Results / Procedures / Treatments   Labs (all labs ordered are listed, but only abnormal results are displayed) Labs Reviewed  RESP PANEL BY RT-PCR (RSV, FLU A&B, COVID)  [id number]  GROUP A STREP BY PCR     EKG  Not indicated.   RADIOLOGY  Image and radiology report reviewed and interpreted by me. Radiology report consistent with the same.  No cardiopulmonary  abnormality.  PROCEDURES:  Critical Care performed: No  Procedures   MEDICATIONS ORDERED IN ED:  Medications  methylPREDNISolone  sodium succinate (SOLU-MEDROL ) 125 mg/2 mL injection 125 mg (125 mg Intravenous Given 06/02/24 0812)  ipratropium-albuterol  (DUONEB) 0.5-2.5 (3) MG/3ML nebulizer solution 3 mL (3 mLs Nebulization Given 06/02/24 0841)  ketorolac  (TORADOL ) 30 MG/ML injection 15 mg (15 mg Intravenous Given 06/02/24 0842)  acetaminophen  (TYLENOL ) tablet 1,000 mg (1,000 mg Oral Given 06/02/24 0841)  ipratropium-albuterol  (DUONEB) 0.5-2.5 (3) MG/3ML nebulizer solution 3 mL (3 mLs Nebulization Given 06/02/24 0952)     IMPRESSION / MDM / ASSESSMENT AND PLAN / ED COURSE   I have reviewed the triage note.  Differential diagnosis includes, but is not limited to, COVID, influenza, bronchitis, pneumonia  Patient's presentation is most consistent with acute illness / injury with system symptoms.  58 year old male presenting to the emergency department for treatment and evaluation of productive cough, headache, and wheezing for the past 2 days.  See HPI for further details.  Vital signs reviewed.  Oxygen saturation is 93%  on room air.  He is slightly tachycardic with heart rate of 103 and has a low-grade temp of 99.2.  On exam, he has audible wheezing throughout.  Plan will be to order a chest x-ray, DuoNeb and Solu-Medrol .    Clinical Course as of 06/02/24 1054  Tue Jun 02, 2024  1009 Solu-Medrol  and first DuoNeb have helped.  Upon reassessment of breath sounds, there is better air movement however he still wheezing.  Second albuterol  treatment ordered.  Respiratory panel is negative. Strep test is negative. Chest x-ray is negative for acute concerns. [CT]  1052 Upon reassessment, patient is sitting up in the bed eating.  He reports feeling much better and now has an appetite.  Breath sounds significantly better.  Patient feels ready for discharge.  He will be discharged home with  prescriptions for albuterol  inhaler, prednisone , and Robitussin AC.  Outpatient follow-up and ER return precautions were discussed. [CT]    Clinical Course User Index [CT] Jannessa Ogden B, FNP     FINAL CLINICAL IMPRESSION(S) / ED DIAGNOSES   Final diagnoses:  Viral upper respiratory tract infection     Rx / DC Orders   ED Discharge Orders          Ordered    predniSONE  (DELTASONE ) 10 MG tablet  Daily        06/02/24 1052    guaiFENesin -codeine  100-10 MG/5ML syrup  3 times daily PRN        06/02/24 1052    albuterol  (VENTOLIN  HFA) 108 (90 Base) MCG/ACT inhaler  Every 4 hours PRN        06/02/24 1052             Note:  This document was prepared using Dragon voice recognition software and may include unintentional dictation errors.   Dylan Kirk NOVAK, FNP 06/02/24 1054    Dylan Holmes, Dylan K, MD 06/02/24 [id number]

## 2024-06-02 NOTE — Discharge Instructions (Signed)
 Follow-up with your primary care provider or return to the emergency department if your symptoms return, change, or worsen.  Use your inhaler every 4-6 hours as prescribed for wheezing, chest tightness, or persistent cough.  Be aware that the cough medicine prescribed may make you sleepy or drowsy and you should not take it if you need to drive or operate machinery.

## 2024-09-24 ENCOUNTER — Other Ambulatory Visit: Payer: Self-pay

## 2024-09-24 ENCOUNTER — Encounter: Payer: Self-pay | Admitting: Emergency Medicine

## 2024-09-24 ENCOUNTER — Emergency Department

## 2024-09-24 DIAGNOSIS — I10 Essential (primary) hypertension: Secondary | ICD-10-CM | POA: Diagnosis not present

## 2024-09-24 DIAGNOSIS — Z7982 Long term (current) use of aspirin: Secondary | ICD-10-CM | POA: Diagnosis not present

## 2024-09-24 DIAGNOSIS — Z79899 Other long term (current) drug therapy: Secondary | ICD-10-CM | POA: Diagnosis not present

## 2024-09-24 DIAGNOSIS — F17210 Nicotine dependence, cigarettes, uncomplicated: Secondary | ICD-10-CM | POA: Insufficient documentation

## 2024-09-24 DIAGNOSIS — R0789 Other chest pain: Secondary | ICD-10-CM | POA: Diagnosis present

## 2024-09-24 LAB — BASIC METABOLIC PANEL WITH GFR
Anion gap: 12 (ref 5–15)
BUN: 17 mg/dL (ref 6–20)
CO2: 22 mmol/L (ref 22–32)
Calcium: 8.6 mg/dL — ABNORMAL LOW (ref 8.9–10.3)
Chloride: 103 mmol/L (ref 98–111)
Creatinine, Ser: 1.09 mg/dL (ref 0.61–1.24)
GFR, Estimated: >60 mL/min (ref >60)
Glucose, Bld: 102 mg/dL — ABNORMAL HIGH (ref 70–99)
Potassium: 3.5 mmol/L (ref 3.5–5.1)
Sodium: 137 mmol/L (ref 135–145)

## 2024-09-24 LAB — CBC
HCT: 40.6 % (ref 39.0–52.0)
Hemoglobin: 13.5 g/dL (ref 13.0–17.0)
MCH: 28.2 pg (ref 26.0–34.0)
MCHC: 33.3 g/dL (ref 30.0–36.0)
MCV: 84.9 fL (ref 80.0–100.0)
Platelets: 221 K/uL (ref 150–400)
RBC: 4.78 MIL/uL (ref 4.22–5.81)
RDW: 12.6 % (ref 11.5–15.5)
WBC: 6.4 K/uL (ref 4.0–10.5)
nRBC: 0 % (ref 0.0–0.2)

## 2024-09-24 LAB — URINALYSIS, ROUTINE W REFLEX MICROSCOPIC
Bilirubin Urine: NEGATIVE
Glucose, UA: NEGATIVE mg/dL
Hgb urine dipstick: NEGATIVE
Ketones, ur: NEGATIVE mg/dL
Nitrite: NEGATIVE
Protein, ur: NEGATIVE mg/dL
Specific Gravity, Urine: 1.013 (ref 1.005–1.030)
pH: 5 (ref 5.0–8.0)

## 2024-09-24 LAB — TROPONIN I (HIGH SENSITIVITY)
Troponin I (High Sensitivity): 21 ng/L — ABNORMAL HIGH (ref <18)
Troponin I (High Sensitivity): 19 ng/L — ABNORMAL HIGH (ref <18)

## 2024-09-24 NOTE — ED Triage Notes (Signed)
 Pt arrives Pov, ambulatory to triage, gait steady, no acute distress noted c/o CP all day w/ sob. Pt states earlier this morning had left sided sharp CP w/ diaphoresis that has since subsided. Now describes chest pain as dull w/ sob and nausea. Pt also reports intermittent left flank pain for months.

## 2024-09-25 ENCOUNTER — Observation Stay: Admit: 2024-09-25 | Discharge: 2024-09-25 | Disposition: A | Attending: Internal Medicine

## 2024-09-25 ENCOUNTER — Observation Stay
Admission: EM | Admit: 2024-09-25 | Discharge: 2024-09-25 | Disposition: A | Discharge status: Home / Self Care | Attending: Internal Medicine | Admitting: Internal Medicine

## 2024-09-25 ENCOUNTER — Other Ambulatory Visit: Payer: Self-pay

## 2024-09-25 DIAGNOSIS — R0782 Intercostal pain: Secondary | ICD-10-CM

## 2024-09-25 DIAGNOSIS — R079 Chest pain, unspecified: Secondary | ICD-10-CM

## 2024-09-25 LAB — HEPATIC FUNCTION PANEL
ALT: 20 U/L (ref 0–44)
AST: 27 U/L (ref 15–41)
Albumin: 4.1 g/dL (ref 3.5–5.0)
Alkaline Phosphatase: 72 U/L (ref 38–126)
Bilirubin, Direct: <0.1 mg/dL (ref 0.0–0.2)
Total Bilirubin: 0.3 mg/dL (ref 0.0–1.2)
Total Protein: 7.4 g/dL (ref 6.5–8.1)

## 2024-09-25 LAB — ECHOCARDIOGRAM COMPLETE
AR max vel: 2.99 cm2
AV Peak grad: 3.9 mmHg
Ao pk vel: 0.99 m/s
Area-P 1/2: 2.69 cm2
S' Lateral: 2.9 cm

## 2024-09-25 LAB — TSH: TSH: 1.38 u[IU]/mL (ref 0.350–4.500)

## 2024-09-25 LAB — LIPASE, BLOOD: Lipase: 31 U/L (ref 11–51)

## 2024-09-25 LAB — D-DIMER, QUANTITATIVE: D-Dimer, Quant: 0.47 ug{FEU}/mL (ref 0.00–0.50)

## 2024-09-25 LAB — TROPONIN I (HIGH SENSITIVITY): Troponin I (High Sensitivity): 15 ng/L (ref <18)

## 2024-09-25 MED ORDER — ALBUTEROL SULFATE (2.5 MG/3ML) 0.083% IN NEBU: 3.0000 mL | INHALATION_SOLUTION | RESPIRATORY_TRACT | Status: DC | PRN

## 2024-09-25 MED ORDER — LIDOCAINE 5 % EX PTCH
1.0000 | MEDICATED_PATCH | CUTANEOUS | Status: DC
  Administered 2024-09-25: 1 via TRANSDERMAL
  Filled 2024-09-25: qty 1

## 2024-09-25 MED ORDER — ENOXAPARIN SODIUM 40 MG/0.4ML IJ SOSY: 40.0000 mg | PREFILLED_SYRINGE | INTRAMUSCULAR | Status: DC

## 2024-09-25 MED ORDER — ACETAMINOPHEN 325 MG PO TABS: 650.0000 mg | ORAL_TABLET | ORAL | Status: DC | PRN

## 2024-09-25 MED ORDER — ALUM & MAG HYDROXIDE-SIMETH 200-200-20 MG/5ML PO SUSP
30.0000 mL | Freq: Once | ORAL | Status: AC
  Administered 2024-09-25: 30 mL via ORAL
  Filled 2024-09-25: qty 30

## 2024-09-25 MED ORDER — LIDOCAINE VISCOUS HCL 2 % MT SOLN
15.0000 mL | Freq: Once | OROMUCOSAL | Status: AC
  Administered 2024-09-25: 15 mL via OROMUCOSAL
  Filled 2024-09-25: qty 15

## 2024-09-25 MED ORDER — PANTOPRAZOLE SODIUM 40 MG IV SOLR
40.0000 mg | Freq: Once | INTRAVENOUS | Status: AC
  Administered 2024-09-25: 40 mg via INTRAVENOUS
  Filled 2024-09-25: qty 10

## 2024-09-25 MED ORDER — ASPIRIN 81 MG PO TBEC: 81.0000 mg | DELAYED_RELEASE_TABLET | Freq: Every day | ORAL | Status: DC

## 2024-09-25 MED ORDER — ASPIRIN 81 MG PO CHEW
324.0000 mg | CHEWABLE_TABLET | Freq: Once | ORAL | Status: AC
  Administered 2024-09-25: 324 mg via ORAL
  Filled 2024-09-25: qty 4

## 2024-09-25 MED ORDER — ACETAMINOPHEN 500 MG PO TABS
1000.0000 mg | ORAL_TABLET | Freq: Once | ORAL | Status: AC
  Administered 2024-09-25: 1000 mg via ORAL
  Filled 2024-09-25: qty 2

## 2024-09-25 MED ORDER — KETOROLAC TROMETHAMINE 15 MG/ML IJ SOLN
15.0000 mg | Freq: Once | INTRAMUSCULAR | Status: AC
  Administered 2024-09-25: 15 mg via INTRAVENOUS
  Filled 2024-09-25: qty 1

## 2024-09-25 MED ORDER — PANTOPRAZOLE SODIUM 20 MG PO TBEC: 20.0000 mg | DELAYED_RELEASE_TABLET | Freq: Every day | ORAL | 1 refills | Status: AC

## 2024-09-25 MED ORDER — ONDANSETRON HCL 4 MG/2ML IJ SOLN: 4.0000 mg | Freq: Four times a day (QID) | INTRAMUSCULAR | Status: DC | PRN

## 2024-09-25 MED ORDER — AMLODIPINE BESYLATE 5 MG PO TABS
5.0000 mg | ORAL_TABLET | Freq: Every day | ORAL | Status: DC
  Administered 2024-09-25: 5 mg via ORAL
  Filled 2024-09-25: qty 1

## 2024-09-25 MED ORDER — OXYCODONE HCL 5 MG PO TABS: 5.0000 mg | ORAL_TABLET | Freq: Four times a day (QID) | ORAL | Status: DC | PRN

## 2024-09-25 MED ORDER — ENOXAPARIN SODIUM 60 MG/0.6ML IJ SOSY
0.5000 mg/kg | PREFILLED_SYRINGE | INTRAMUSCULAR | Status: DC
  Administered 2024-09-25: 55 mg via SUBCUTANEOUS
  Filled 2024-09-25: qty 0.6

## 2024-09-25 NOTE — Discharge Summary (Signed)
 Physician Discharge Summary   Patient: Dylan Holmes MRN: [id number] DOB: 12-17-1965  Admit date:     09/25/2024  Discharge date: 09/25/24  Discharge Physician: Cort ONEIDA Mana   PCP: Therapy, [facility]   Recommendations at discharge:    PCP in 1-2 wks Cardiology in 2 weeks to discuss about stress test  Discharge Diagnoses: Principal Problem:   Chest pain   Hospital Course:  Patient with a history of hypertension noncompliant with BP meds presented with pressure-like chest pain.  Symptoms lasted for more than 24 hours, EKG showed no acute ST changes and troponin trending 19> 21> 15.  Echocardiogram showed normal LVEF with no focal wall motion abnormalities.  Physical exam indicating patient's chest pain is of skeletal muscular nature, clinically suspect costochondritis.  After differential including esophageal spasm as patient appeared to have poorly controlled indigestion and reflux problem.  Poorly responded to OTC PPI.  Patient was given lidocaine patch and I sent a prescription of Protonix  to [facility] pharmacy.  Recommend patient follow-up with cardiology to discuss about possible outpatient stress test.  Patient and his family agreed with the plan.      Pain control - [place]  Controlled Substance Reporting System database was reviewed. and patient was instructed, not to drive, operate heavy machinery, perform activities at heights, swimming or participation in water activities or provide baby-sitting services while on Pain, Sleep and Anxiety Medications; until their outpatient Physician has advised to do so again. Also recommended to not to take more than prescribed Pain, Sleep and Anxiety Medications.  Consultants: None Procedures performed: Echocardiogram Disposition: Home Diet recommendation:  Cardiac diet DISCHARGE MEDICATION: Allergies as of 09/25/2024   No Known Allergies      Medication List     STOP taking these  medications    predniSONE  10 MG tablet Commonly known as: DELTASONE        TAKE these medications    albuterol  108 (90 Base) MCG/ACT inhaler Commonly known as: VENTOLIN  HFA Inhale 2 puffs into the lungs every 4 (four) hours as needed for wheezing or shortness of breath.   amLODipine  5 MG tablet Commonly known as: NORVASC  Take 5 mg by mouth daily. What changed: Another medication with the same name was removed. Continue taking this medication, and follow the directions you see here.   aspirin  EC 81 MG tablet Take 81 mg by mouth daily.   CENTRUM MEN PO Take 1 tablet by mouth daily.   guaiFENesin -codeine  100-10 MG/5ML syrup Take 10 mLs by mouth 3 (three) times daily as needed.   oxyCODONE  5 MG immediate release tablet Commonly known as: Oxy IR/ROXICODONE  Take 1 tablet (5 mg total) by mouth every 6 (six) hours as needed for severe pain.   pantoprazole  20 MG tablet Commonly known as: Protonix  Take 1 tablet (20 mg total) by mouth daily.        Discharge Exam: Filed Weights   09/24/24 2040  Weight: 110.7 kg   Eyes: PERRL, lids and conjunctivae normal ENMT: Mucous membranes are moist. Posterior pharynx clear of any exudate or lesions.Normal dentition.  Neck: normal, supple, no masses, no thyromegaly Respiratory: clear to auscultation bilaterally, no wheezing, no crackles. Normal respiratory effort. No accessory muscle use.  Cardiovascular: Regular rate and rhythm, no murmurs / rubs / gallops. No extremity edema. 2+ pedal pulses. No carotid bruits.  Abdomen: no tenderness, no masses palpated. No hepatosplenomegaly. Bowel sounds positive.  Musculoskeletal: no clubbing / cyanosis. No joint deformity upper and lower extremities.  Good ROM, no contractures. Normal muscle tone. Tenderness on sternum and surrounding rib cage. Skin: no rashes, lesions, ulcers. No induration Neurologic: CN 2-12 grossly intact. Sensation intact, DTR normal.  Muscle strength 5/5 on both  sides Psychiatric: Normal judgment and insight. Alert and oriented x 3. Normal mood.     Condition at discharge: good  The results of significant diagnostics from this hospitalization (including imaging, microbiology, ancillary and laboratory) are listed below for reference.   Imaging Studies: ECHOCARDIOGRAM COMPLETE Result Date: 09/25/2024    ECHOCARDIOGRAM REPORT   Patient Name:   Dylan Holmes Date of Exam: 09/25/2024 Medical Rec #:  [id number]     Height:       71.0 in Accession #:    [id number]    Weight:       244.0 lb Date of Birth:  [birth year]     BSA:          2.295 m Patient Age:    58 years      BP:           141/60 mmHg Patient Gender: M             HR:           52 bpm. Exam Location:  [facility] Procedure: 2D Echo, Cardiac Doppler and Color Doppler (Both Spectral and Color            Flow Doppler were utilized during procedure). Indications:     Chest Pain R07.9  History:         Patient has prior history of Echocardiogram examinations, most                  recent 01/19/2021. Angina and Previous Myocardial Infarction,                  Signs/Symptoms:Chest Pain; Risk Factors:Hypertension.  Sonographer:     Thea Norlander RCS Referring Phys:  [id number] CORT ONEIDA MANA Diagnosing Phys: Redell Cave MD IMPRESSIONS  1. Left ventricular ejection fraction, by estimation, is 60 to 65%. The left ventricle has normal function. The left ventricle has no regional wall motion abnormalities. Left ventricular diastolic parameters are consistent with Grade I diastolic dysfunction (impaired relaxation).  2. Right ventricular systolic function is normal. The right ventricular size is normal.  3. The mitral valve is normal in structure. No evidence of mitral valve regurgitation.  4. The aortic valve is tricuspid. Aortic valve regurgitation is not visualized.  5. The inferior vena cava is normal in size with greater than 50% respiratory variability, suggesting right atrial pressure of 3 mmHg. FINDINGS  Left  Ventricle: Left ventricular ejection fraction, by estimation, is 60 to 65%. The left ventricle has normal function. The left ventricle has no regional wall motion abnormalities. The left ventricular internal cavity size was normal in size. There is  no left ventricular hypertrophy. Left ventricular diastolic parameters are consistent with Grade I diastolic dysfunction (impaired relaxation). Right Ventricle: The right ventricular size is normal. No increase in right ventricular wall thickness. Right ventricular systolic function is normal. Left Atrium: Left atrial size was normal in size. Right Atrium: Right atrial size was normal in size. Pericardium: There is no evidence of pericardial effusion. Mitral Valve: The mitral valve is normal in structure. No evidence of mitral valve regurgitation. Tricuspid Valve: The tricuspid valve is normal in structure. Tricuspid valve regurgitation is mild. Aortic Valve: The aortic valve is tricuspid. Aortic valve regurgitation is not visualized. Aortic valve peak gradient measures 3.9  mmHg. Pulmonic Valve: The pulmonic valve was normal in structure. Pulmonic valve regurgitation is not visualized. Aorta: The aortic root and ascending aorta are structurally normal, with no evidence of dilitation. Venous: The inferior vena cava is normal in size with greater than 50% respiratory variability, suggesting right atrial pressure of 3 mmHg. IAS/Shunts: No atrial level shunt detected by color flow Doppler.  LEFT VENTRICLE PLAX 2D LVIDd:         4.40 cm   Diastology LVIDs:         2.90 cm   LV e' medial:    5.55 cm/s LV PW:         1.10 cm   LV E/e' medial:  10.6 LV IVS:        1.00 cm   LV e' lateral:   7.18 cm/s LVOT diam:     2.10 cm   LV E/e' lateral: 8.2 LV SV:         59 LV SV Index:   26 LVOT Area:     3.46 cm  RIGHT VENTRICLE             IVC RV S prime:     18.60 cm/s  IVC diam: 1.75 cm TAPSE (M-mode): 2.4 cm LEFT ATRIUM             Index        RIGHT ATRIUM           Index LA diam:         3.60 cm 1.57 cm/m   RA Area:     18.30 cm LA Vol (A2C):   30.3 ml 13.18 ml/m  RA Volume:   56.10 ml  24.45 ml/m LA Vol (A4C):   43.2 ml 18.85 ml/m LA Biplane Vol: 36.7 ml 15.99 ml/m  AORTIC VALVE AV Area (Vmax): 2.99 cm AV Vmax:        98.90 cm/s AV Peak Grad:   3.9 mmHg LVOT Vmax:      85.30 cm/s LVOT Vmean:     51.800 cm/s LVOT VTI:       0.169 m  AORTA Ao Root diam: 3.40 cm Ao Asc diam:  3.50 cm MITRAL VALVE               TRICUSPID VALVE MV Area (PHT): 2.69 cm    TR Peak grad:   4.2 mmHg MV Decel Time: 282 msec    TR Vmax:        103.00 cm/s MV E velocity: 59.10 cm/s MV A velocity: 95.10 cm/s  SHUNTS MV E/A ratio:  0.62        Systemic VTI:  0.17 m                            Systemic Diam: 2.10 cm Redell Cave MD Electronically signed by Redell Cave MD Signature Date/Time: 09/25/2024/12:42:59 PM    Final    DG Chest 2 View Result Date: 09/24/2024 EXAM: 2 VIEW(S) XRAY OF THE CHEST 09/24/2024 08:54:00 PM COMPARISON: 06/02/2024 CLINICAL HISTORY: cp. Pt arrives Pov, ambulatory to triage, gait steady, no acute distress noted c/o CP all day w/ sob. Pt states earlier this morning had left sided sharp CP w/ diaphoresis that has since subsided. Now describes chest pain as dull w/ sob and nausea. Pt also ; reports intermittent left flank pain for months. FINDINGS: LUNGS AND PLEURA: No focal pulmonary opacity. No pulmonary edema. No pleural effusion. No pneumothorax. HEART  AND MEDIASTINUM: No acute abnormality of the cardiac and mediastinal silhouettes. BONES AND SOFT TISSUES: No acute osseous abnormality. IMPRESSION: 1. Normal chest radiographs. Electronically signed by: Pinkie Pebbles MD 09/24/2024 09:04 PM EDT RP Workstation: [id number]    Microbiology: Results for orders placed or performed during the hospital encounter of 06/02/24  Resp panel by RT-PCR (RSV, Flu A&B, Covid) Anterior Nasal Swab     Status: None   Collection Time: 06/02/24  7:00 AM   Specimen: Anterior Nasal Swab   Result Value Ref Range Status   SARS Coronavirus 2 by RT PCR NEGATIVE NEGATIVE Final    Comment: (NOTE) SARS-CoV-2 target nucleic acids are NOT DETECTED.  The SARS-CoV-2 RNA is generally detectable in upper respiratory specimens during the acute phase of infection. The lowest concentration of SARS-CoV-2 viral copies this assay can detect is 138 copies/mL. A negative result does not preclude SARS-Cov-2 infection and should not be used as the sole basis for treatment or other patient management decisions. A negative result may occur with  improper specimen collection/handling, submission of specimen other than nasopharyngeal swab, presence of viral mutation(s) within the areas targeted by this assay, and inadequate number of viral copies(<138 copies/mL). A negative result must be combined with clinical observations, patient history, and epidemiological information. The expected result is Negative.  Fact Sheet for Patients:  BloggerCourse.com  Fact Sheet for Healthcare Providers:  SeriousBroker.it  This test is no t yet approved or cleared by the [place]  FDA and  has been authorized for detection and/or diagnosis of SARS-CoV-2 by FDA under an Emergency Use Authorization (EUA). This EUA will remain  in effect (meaning this test can be used) for the duration of the COVID-19 declaration under Section 564(b)(1) of the Act, 21 U.S.C.section 360bbb-3(b)(1), unless the authorization is terminated  or revoked sooner.       Influenza A by PCR NEGATIVE NEGATIVE Final   Influenza B by PCR NEGATIVE NEGATIVE Final    Comment: (NOTE) The Xpert Xpress SARS-CoV-2/FLU/RSV plus assay is intended as an aid in the diagnosis of influenza from Nasopharyngeal swab specimens and should not be used as a sole basis for treatment. Nasal washings and aspirates are unacceptable for Xpert Xpress SARS-CoV-2/FLU/RSV testing.  Fact Sheet for  Patients: BloggerCourse.com  Fact Sheet for Healthcare Providers: SeriousBroker.it  This test is not yet approved or cleared by the [place]  FDA and has been authorized for detection and/or diagnosis of SARS-CoV-2 by FDA under an Emergency Use Authorization (EUA). This EUA will remain in effect (meaning this test can be used) for the duration of the COVID-19 declaration under Section 564(b)(1) of the Act, 21 U.S.C. section 360bbb-3(b)(1), unless the authorization is terminated or revoked.     Resp Syncytial Virus by PCR NEGATIVE NEGATIVE Final    Comment: (NOTE) Fact Sheet for Patients: BloggerCourse.com  Fact Sheet for Healthcare Providers: SeriousBroker.it  This test is not yet approved or cleared by the [place]  FDA and has been authorized for detection and/or diagnosis of SARS-CoV-2 by FDA under an Emergency Use Authorization (EUA). This EUA will remain in effect (meaning this test can be used) for the duration of the COVID-19 declaration under Section 564(b)(1) of the Act, 21 U.S.C. section 360bbb-3(b)(1), unless the authorization is terminated or revoked.  Performed at [facility], [address] [place], [place] [address]   Group A Strep by PCR [facility] Only)     Status: None   Collection Time: 06/02/24  7:00 AM   Specimen: Anterior Nasal  Swab; Sterile Swab  Result Value Ref Range Status   Group A Strep by PCR NOT DETECTED NOT DETECTED Final    Comment: Performed at [facility], [address] [place], [place] [address]    Labs: CBC: Recent Labs  Lab 09/24/24 2048  WBC 6.4  HGB 13.5  HCT 40.6  MCV 84.9  PLT 221   Basic Metabolic Panel: Recent Labs  Lab 09/24/24 2048  NA 137  K 3.5  CL 103  CO2 22  GLUCOSE 102*  BUN 17  CREATININE 1.09  CALCIUM  8.6*   Liver Function Tests: Recent Labs  Lab 09/24/24 2312  AST 27   ALT 20  ALKPHOS 72  BILITOT 0.3  PROT 7.4  ALBUMIN 4.1   CBG: No results for input(s): GLUCAP in the last 168 hours.  Discharge time spent: less than 30 minutes.  Signed: Cort ONEIDA Mana, MD [facility] 09/25/2024

## 2024-09-25 NOTE — Progress Notes (Signed)
 PHARMACIST - PHYSICIAN COMMUNICATION  CONCERNING:  Enoxaparin (Lovenox) for DVT Prophylaxis    RECOMMENDATION: Patient was prescribed enoxaprin 40mg  q24 hours for VTE prophylaxis.   Filed Weights   09/24/24 2040  Weight: 110.7 kg (244 lb)    Body mass index is 34.03 kg/m.  Estimated Creatinine Clearance: 91.2 mL/min (by C-G formula based on SCr of 1.09 mg/dL).   Based on [facility] policy patient is candidate for enoxaparin 0.5mg /kg TBW SQ every 24 hours based on BMI being >30.   DESCRIPTION: Pharmacy has adjusted enoxaparin dose per [facility] policy.  Patient is now receiving enoxaparin 55 mg every 24 hours    Estill CHRISTELLA Lutes, PharmD, BCPS Clinical Pharmacist 09/25/2024 7:59 AM

## 2024-09-25 NOTE — ED Provider Notes (Signed)
 [facility] Provider Note    Event Date/Time   First MD Initiated Contact with Patient 09/25/24 0122     (approximate)   History   Chest Pain, Shortness of Breath, and Flank Pain   HPI  Dylan Holmes is a 58 y.o. male who comes in with chest pain and shortness of breath.  Patient reported left-sided chest pain that has now gotten improved.  Patient reports pain on the middle of his chest that feels a elephant sitting on it.  He denies any radiation into his back.  He reports a little bit of shortness of breath associated with it.  He states the pain is worse with exertion and.  Does report some left flank pain but this is been a chronic issue.  I reviewed the notes where he was seen in the ER 2 years ago for left leg pain had a CT renal that was negative.  He reports this is the same pain as he has had then that the new thing is more of the pressure in his chest.  I reviewed the note where patient was admitted on 11/13/2020.  He had a CTA that showed some mild distal esophageal thickening and had a calcium  score of 0.  Patient was started on PPI and recommended to follow-up outpatient.  Plan was for cardiology to scan additional a echocardiogram.  He did have this done on 2022 that was overall reassuring with some slight thickening secondary to high blood pressure but normal pulm function   Physical Exam   Triage Vital Signs: ED Triage Vitals  Encounter Vitals Group     BP 09/24/24 2045 (!) 149/78     Girls Systolic BP Percentile --      Girls Diastolic BP Percentile --      Boys Systolic BP Percentile --      Boys Diastolic BP Percentile --      Pulse Rate 09/24/24 2045 70     Resp 09/24/24 2045 18     Temp 09/24/24 2045 98.8 F (37.1 C)     Temp Source 09/24/24 2045 Oral     SpO2 09/24/24 2045 96 %     Weight 09/24/24 2040 244 lb (110.7 kg)     Height 09/24/24 2040 5' 11 (1.803 m)     Head Circumference --      Peak Flow --      Pain Score  09/24/24 2045 6     Pain Loc --      Pain Education --      Exclude from Growth Chart --     Most recent vital signs: Vitals:   09/24/24 2045 09/24/24 2311  BP: (!) 149/78 (!) 149/92  Pulse: 70 79  Resp: 18 18  Temp: 98.8 F (37.1 C) 98.2 F (36.8 C)  SpO2: 96% 99%     General: Awake, no distress.  CV:  Good peripheral perfusion.  Resp:  Normal effort.  Abd:  No distention.  Soft and nontender Other:  No swelling in legs.  No calf tenderness   ED Results / Procedures / Treatments   Labs (all labs ordered are listed, but only abnormal results are displayed) Labs Reviewed  BASIC METABOLIC PANEL WITH GFR - Abnormal; Notable for the following components:      Result Value   Glucose, Bld 102 (*)    Calcium  8.6 (*)    All other components within normal limits  URINALYSIS, ROUTINE W REFLEX MICROSCOPIC - Abnormal; Notable  for the following components:   Color, Urine YELLOW (*)    APPearance CLEAR (*)    Leukocytes,Ua TRACE (*)    Bacteria, UA RARE (*)    All other components within normal limits  TROPONIN I (HIGH SENSITIVITY) - Abnormal; Notable for the following components:   Troponin I (High Sensitivity) 21 (*)    All other components within normal limits  TROPONIN I (HIGH SENSITIVITY) - Abnormal; Notable for the following components:   Troponin I (High Sensitivity) 19 (*)    All other components within normal limits  CBC     EKG  My interpretation of EKG:  Normal sinus rate of 60 without any ST elevation, possible T wave inversion lead III but there is some artifact.  Normal sinus rate of 66 that any ST elevation, T wave inversion in lead III and in V6, normal intervals.  Reviewed prior EKG and he has had some T wave inversions previously on Apr 09, 2022 RADIOLOGY I have reviewed the xray personally and interpreted no evidence of any pneumonia   PROCEDURES:  Critical Care performed: No  .1-3 Lead EKG Interpretation  Performed by: Ernest Ronal BRAVO,  MD Authorized by: Ernest Ronal BRAVO, MD     Interpretation: normal     ECG rate:  50   ECG rate assessment: normal     Rhythm: sinus rhythm     Ectopy: none     Conduction: normal      MEDICATIONS ORDERED IN ED: Medications  lidocaine (LIDODERM) 5 % 1 patch (1 patch Transdermal Patch Applied 09/25/24 0242)  acetaminophen  (TYLENOL ) tablet 1,000 mg (1,000 mg Oral Given 09/25/24 0243)  ketorolac  (TORADOL ) 15 MG/ML injection 15 mg (15 mg Intravenous Given 09/25/24 0244)  alum & mag hydroxide-simeth (MAALOX/MYLANTA) 200-200-20 MG/5ML suspension 30 mL (30 mLs Oral Given 09/25/24 0355)  lidocaine (XYLOCAINE) 2 % viscous mouth solution 15 mL (15 mLs Mouth/Throat Given 09/25/24 0355)  pantoprazole  (PROTONIX ) injection 40 mg (40 mg Intravenous Given 09/25/24 0356)  aspirin  chewable tablet 324 mg (324 mg Oral Given 09/25/24 0501)     IMPRESSION / MDM / ASSESSMENT AND PLAN / ED COURSE  I reviewed the triage vital signs and the nursing notes.   Patient's presentation is most consistent with acute presentation with potential threat to life or bodily function.     INITIAL IMPRESSION / ASSESSMENT AND PLAN / ED COURSE   Most Likely DDx:  -Consider ACS vs MSK Vs Thalia- will get EKG/troponin to evaluate for ACS       DDx that was also considered d/t potential to cause harm, but was found less likely based on history and physical (as detailed above): -PNA (no fevers, cough but CXR to evaluate) -PNX (reassured with equal b/l breath sounds, CXR to evaluate) -Symptomatic anemia (will get H&H) -Pulmonary embolism as no sob at rest, not pleuritic in nature, no hypoxia- ddimer -Aortic Dissection as no tearing pain and no radiation to the mid back, pulses equal, no murmur -Pericarditis no EKG changes or hx to suggest dx -Tamponade (no notable SOB, tachycardic, hypotensive) -Esophageal rupture (no h/o diffuse vomitting/no crepitus)   Tropoins are downtrending from 21-19 .  He continues to report 7  out of 10 pain after multiple medications were given.  He reports no relief with treatments for potential gastritis.  He reports exertional component will discuss with hospital team for admission for cardiac evaluation.   The patient is on the cardiac monitor to evaluate for evidence of arrhythmia and/or significant  heart rate changes.      FINAL CLINICAL IMPRESSION(S) / ED DIAGNOSES   Final diagnoses:  Exertional chest pain     Rx / DC Orders   ED Discharge Orders     None        Note:  This document was prepared using Dragon voice recognition software and may include unintentional dictation errors.   Ernest Ronal BRAVO, MD 09/25/24 [phone]

## 2024-09-25 NOTE — H&P (Signed)
 History and Physical    Dylan Holmes FMW:[id number] DOB: 1966-05-06 DOA: 09/25/2024  PCP: Therapy, [facility] (Confirm with patient/family/NH records and if not entered, this has to be entered at [facility] point of entry) Patient coming from: Home  I have personally briefly reviewed patient's old medical records in [facility]  Chief Complaint: Chest pain  HPI: Dylan Holmes is a 58 y.o. male with medical history significant of HTN, presented with postoperative chest pain.  Patient reports that 2 days ago he ran out of his blood pressure medications,  both of them at same time.  Yesterday morning around 10 AM, patient was at home and started to feel pressure-like chest pain, 7-8/10, radiated to left shoulder associated with sweating.  Worsening with moving upper torso.  He did not take any medication for the pain but just took rest with some relief.  However the pain never went away through afternoon and evening.  Wife saw him in the afternoon and found him in sweat.  ED Course: Afebrile, blood pressure 150/80 O2 saturation 99% on room air.  Troponin trending 21> 19.  EKG showed sinus rhythm, with T wave flattening in the inferior leads, chronic.  Patient was given Maalox PPI, [place], chest pain still remains 3-4/10.  Review of Systems: As per HPI otherwise 14 point review of systems negative.    Past Medical History:  Diagnosis Date   Hypertension     Past Surgical History:  Procedure Laterality Date   APPENDECTOMY       reports that he has been smoking cigarettes. He has never used smokeless tobacco. He reports current alcohol use. He reports that he does not currently use drugs after having used the following drugs: Marijuana.  No Known Allergies  History reviewed. No pertinent family history.   Prior to Admission medications   Medication Sig Start Date End Date Taking? Authorizing Provider  albuterol  (VENTOLIN  HFA) 108 (90 Base)  MCG/ACT inhaler Inhale 2 puffs into the lungs every 4 (four) hours as needed for wheezing or shortness of breath. 06/02/24  Yes Triplett, Cari B, FNP  amLODipine  (NORVASC ) 5 MG tablet Take 1 tablet (5 mg total) by mouth daily. 12/16/20 09/25/24 Yes Kate Lonni CROME, MD  amLODipine  (NORVASC ) 5 MG tablet Take 5 mg by mouth daily.   Yes [provider]  aspirin  EC 81 MG tablet Take 81 mg by mouth daily.   Yes [provider]  guaiFENesin -codeine  100-10 MG/5ML syrup Take 10 mLs by mouth 3 (three) times daily as needed. 06/02/24  Yes Triplett, Cari B, FNP  Multiple Vitamins-Minerals (CENTRUM MEN PO) Take 1 tablet by mouth daily.   Yes [provider]  oxyCODONE  (OXY IR/ROXICODONE ) 5 MG immediate release tablet Take 1 tablet (5 mg total) by mouth every 6 (six) hours as needed for severe pain. 05/28/22  Yes Saunders Givens L, PA-C  predniSONE  (DELTASONE ) 10 MG tablet Take 5 tablets (50 mg total) by mouth daily. 06/02/24   Herlinda Kirk NOVAK, FNP    Physical Exam: Vitals:   09/25/24 0430 09/25/24 0600 09/25/24 0630 09/25/24 0700  BP: 129/75 133/65 (!) 123/46 (!) 141/60  Pulse: (!) 51 (!) 53 (!) 54 (!) 54  Resp: 14 15 13 15   Temp:      TempSrc:      SpO2: 98% 100% 100% 100%  Weight:      Height:        Constitutional: NAD, calm, comfortable Vitals:   09/25/24 0430  09/25/24 0600 09/25/24 0630 09/25/24 0700  BP: 129/75 133/65 (!) 123/46 (!) 141/60  Pulse: (!) 51 (!) 53 (!) 54 (!) 54  Resp: 14 15 13 15   Temp:      TempSrc:      SpO2: 98% 100% 100% 100%  Weight:      Height:       Eyes: PERRL, lids and conjunctivae normal ENMT: Mucous membranes are moist. Posterior pharynx clear of any exudate or lesions.Normal dentition.  Neck: normal, supple, no masses, no thyromegaly Respiratory: clear to auscultation bilaterally, no wheezing, no crackles. Normal respiratory effort. No accessory muscle use.  Cardiovascular: Regular rate and rhythm, no murmurs / rubs / gallops.  No extremity edema. 2+ pedal pulses. No carotid bruits.  Abdomen: no tenderness, no masses palpated. No hepatosplenomegaly. Bowel sounds positive.  Musculoskeletal: no clubbing / cyanosis. No joint deformity upper and lower extremities. Good ROM, no contractures. Normal muscle tone.  Significant tenderness on pressing sternum and surrounding rib cage Skin: no rashes, lesions, ulcers. No induration Neurologic: CN 2-12 grossly intact. Sensation intact, DTR normal. Strength 5/5 in all 4.  Psychiatric: Normal judgment and insight. Alert and oriented x 3. Normal mood.     Labs on Admission: I have personally reviewed following labs and imaging studies  CBC: Recent Labs  Lab 09/24/24 2048  WBC 6.4  HGB 13.5  HCT 40.6  MCV 84.9  PLT 221   Basic Metabolic Panel: Recent Labs  Lab 09/24/24 2048  NA 137  K 3.5  CL 103  CO2 22  GLUCOSE 102*  BUN 17  CREATININE 1.09  CALCIUM  8.6*   GFR: Estimated Creatinine Clearance: 91.2 mL/min (by C-G formula based on SCr of 1.09 mg/dL). Liver Function Tests: Recent Labs  Lab 09/24/24 2312  AST 27  ALT 20  ALKPHOS 72  BILITOT 0.3  PROT 7.4  ALBUMIN 4.1   Recent Labs  Lab 09/24/24 2312  LIPASE 31   No results for input(s): AMMONIA in the last 168 hours. Coagulation Profile: No results for input(s): INR, PROTIME in the last 168 hours. Cardiac Enzymes: No results for input(s): CKTOTAL, CKMB, CKMBINDEX, TROPONINI in the last 168 hours. BNP (last 3 results) No results for input(s): PROBNP in the last 8760 hours. HbA1C: No results for input(s): HGBA1C in the last 72 hours. CBG: No results for input(s): GLUCAP in the last 168 hours. Lipid Profile: No results for input(s): CHOL, HDL, LDLCALC, TRIG, CHOLHDL, LDLDIRECT in the last 72 hours. Thyroid  Function Tests: No results for input(s): TSH, T4TOTAL, FREET4, T3FREE, THYROIDAB in the last 72 hours. Anemia Panel: No results for input(s):  VITAMINB12, FOLATE, FERRITIN, TIBC, IRON, RETICCTPCT in the last 72 hours. Urine analysis:    Component Value Date/Time   COLORURINE YELLOW (A) 09/24/2024 2048   APPEARANCEUR CLEAR (A) 09/24/2024 2048   LABSPEC 1.013 09/24/2024 2048   PHURINE 5.0 09/24/2024 2048   GLUCOSEU NEGATIVE 09/24/2024 2048   HGBUR NEGATIVE 09/24/2024 2048   BILIRUBINUR NEGATIVE 09/24/2024 2048   KETONESUR NEGATIVE 09/24/2024 2048   PROTEINUR NEGATIVE 09/24/2024 2048   NITRITE NEGATIVE 09/24/2024 2048   LEUKOCYTESUR TRACE (A) 09/24/2024 2048    Radiological Exams on Admission: DG Chest 2 View Result Date: 09/24/2024 EXAM: 2 VIEW(S) XRAY OF THE CHEST 09/24/2024 08:54:00 PM COMPARISON: 06/02/2024 CLINICAL HISTORY: cp. Pt arrives Pov, ambulatory to triage, gait steady, no acute distress noted c/o CP all day w/ sob. Pt states earlier this morning had left sided sharp CP w/ diaphoresis that has since  subsided. Now describes chest pain as dull w/ sob and nausea. Pt also ; reports intermittent left flank pain for months. FINDINGS: LUNGS AND PLEURA: No focal pulmonary opacity. No pulmonary edema. No pleural effusion. No pneumothorax. HEART AND MEDIASTINUM: No acute abnormality of the cardiac and mediastinal silhouettes. BONES AND SOFT TISSUES: No acute osseous abnormality. IMPRESSION: 1. Normal chest radiographs. Electronically signed by: Pinkie Pebbles MD 09/24/2024 09:04 PM EDT RP Workstation: [id number]    EKG: Independently reviewed.  Sinus rhythm, T wave flattening on inferior leads, chronic.  Assessment/Plan Principal Problem:   Chest pain  (please populate well all problems here in Problem List. (For example, if patient is on BP meds at home and you resume or decide to hold them, it is a problem that needs to be her. Same for CAD, COPD, HLD and so on)  Atypical chest pain - Rule out ACS.  Troponin trending flat, EKG showed chronic T wave flattening on inferior leads.  ACS unlikely, will check  echocardiogram.  Etiology of chest pain likely related to noncoherent with BP medications. -If echo negative, likely patient can be discharged home and follow-up with cardiology for outpatient stress test.  Patient and family agreed with the plan. - Other DDx, clinical also suspect costochondritis as patient has significant tenderness on palpation on front chest wall.  D-dimer negative, low suspicion for PE.  Will hold off further CT study. - Continue aspirin , check risks factors.  Check UDS.  HTN, noncompliant with home BP meds - Resume amlodipine   DVT prophylaxis: Lovenox Code Status: Full code Family Communication: Wife at bedside Disposition Plan: Expect less than 2 midnight hospital stay Consults called: None Admission status: Telemetry observation   Cort ONEIDA Mana MD [facility] Pager [phone]  09/25/2024, 8:22 AM

## 2024-09-25 NOTE — Progress Notes (Signed)
 Echocardiogram 2D Echocardiogram has been performed.  Thea Norlander 09/25/2024, 9:45 AM

## 2024-11-30 HISTORY — PX: HERNIA REPAIR: SHX51

## 2024-12-18 NOTE — Progress Notes (Signed)
 Post-Operative Visit  Patient Dylan Holmes is a 59 y.o. male who presents 3 weeks after robotic ventral hernia repair with mesh with me on 11/30/24. He did have some pain control issues which improved with Robaxin post-op at home.  They report doing well. Patient is eating a regular diet. He has started walking more for exercise. Of concern: continues to have a lot of abdominal wall discomfort.  Physical Exam: Vitals:   12/18/24 1357  BP: (!) 178/110  Pulse:   SpO2:    CV: RRR Lungs: CTAB Abdomen: surgical sites are well healed and covered with Dermabond No erythema No drainage  Assessment and Plan: Patient Dylan Holmes is 3 weeks from robotic ventral hernia repair and recovering as expected. He still has some abdominal discomfort. Also some altered sensation in the lower abdomen. We will see how this is in 6 weeks.  Meds: Started on Valium 5mg  for muscle spasms. Stop Robaxin and stop tramadol. Wound care: can put Vaseline over the incisions. No lotions/creams with fragrances/chemicals. Return to clinic: as needed. He will call me in 2 weeks to see if he can go to work without Valium.  Dylan Gaylan Brothers, MD 12/18/2024 2:11 PM

## 2024-12-23 ENCOUNTER — Emergency Department

## 2024-12-23 ENCOUNTER — Emergency Department
Admission: EM | Admit: 2024-12-23 | Discharge: 2024-12-23 | Disposition: A | Discharge status: Home / Self Care | Attending: Emergency Medicine | Admitting: Emergency Medicine

## 2024-12-23 ENCOUNTER — Encounter: Payer: Self-pay | Admitting: Emergency Medicine

## 2024-12-23 ENCOUNTER — Other Ambulatory Visit: Payer: Self-pay

## 2024-12-23 DIAGNOSIS — R079 Chest pain, unspecified: Secondary | ICD-10-CM | POA: Diagnosis present

## 2024-12-23 LAB — BASIC METABOLIC PANEL WITH GFR
Anion gap: 13 (ref 5–15)
BUN: 14 mg/dL (ref 6–20)
CO2: 24 mmol/L (ref 22–32)
Calcium: 9.7 mg/dL (ref 8.9–10.3)
Chloride: 102 mmol/L (ref 98–111)
Creatinine, Ser: 1.05 mg/dL (ref 0.61–1.24)
GFR, Estimated: >60 mL/min
Glucose, Bld: 100 mg/dL — ABNORMAL HIGH (ref 70–99)
Potassium: 3.9 mmol/L (ref 3.5–5.1)
Sodium: 140 mmol/L (ref 135–145)

## 2024-12-23 LAB — CBC
HCT: 42.7 % (ref 39.0–52.0)
Hemoglobin: 14.2 g/dL (ref 13.0–17.0)
MCH: 27.9 pg (ref 26.0–34.0)
MCHC: 33.3 g/dL (ref 30.0–36.0)
MCV: 83.9 fL (ref 80.0–100.0)
Platelets: 261 K/uL (ref 150–400)
RBC: 5.09 MIL/uL (ref 4.22–5.81)
RDW: 12.3 % (ref 11.5–15.5)
WBC: 7.1 K/uL (ref 4.0–10.5)
nRBC: 0 % (ref 0.0–0.2)

## 2024-12-23 LAB — TROPONIN T, HIGH SENSITIVITY
Troponin T High Sensitivity: 16 ng/L (ref 0–19)
Troponin T High Sensitivity: <15 ng/L (ref 0–19)

## 2024-12-23 LAB — D-DIMER, QUANTITATIVE: D-Dimer, Quant: 0.79 ug{FEU}/mL — ABNORMAL HIGH (ref 0.00–0.50)

## 2024-12-23 MED ORDER — LORAZEPAM 1 MG PO TABS
1.0000 mg | ORAL_TABLET | Freq: Once | ORAL | Status: AC
  Administered 2024-12-23: 1 mg via ORAL
  Filled 2024-12-23: qty 1

## 2024-12-23 MED ORDER — IOHEXOL 350 MG/ML SOLN
100.0000 mL | Freq: Once | INTRAVENOUS | Status: AC | PRN
  Administered 2024-12-23: 100 mL via INTRAVENOUS

## 2024-12-23 NOTE — ED Provider Triage Note (Signed)
 Emergency Medicine Provider Triage Evaluation Note  Dylan Holmes , a 59 y.o. male  was evaluated in triage.  Pt complains of chest pain, nausea, some shortness of breath, recent surgery 3 weeks ago.  Used to take nitro pills but they took him off of them.  States having blurred vision.  No headache..  Review of Systems  Positive:  Negative:   Physical Exam  BP (!) 166/102 (BP Location: Left Arm)   Pulse 84   Temp 98.5 F (36.9 C) (Oral)   Resp 18   Ht 5' 11 (1.803 m)   Wt 110 kg   SpO2 96%   BMI 33.82 kg/m  Gen:   Awake, no distress   Resp:  Normal effort  MSK:   Moves extremities without difficulty  Other:    Medical Decision Making  Medically screening exam initiated at 5:29 PM.  Appropriate orders placed.  Dylan Holmes was informed that the remainder of the evaluation will be completed by another provider, this initial triage assessment does not replace that evaluation, and the importance of remaining in the ED until their evaluation is complete.  ACS/stroke protocol was initiated, symptoms greater than 4 hours   Gasper Devere ORN, PA-C 12/23/24 1730

## 2024-12-23 NOTE — ED Triage Notes (Signed)
 Pt endorses lightheadedness, CP and vomiting today. Hernia surgery last month.

## 2024-12-23 NOTE — ED Provider Notes (Signed)
 "  [facility] Provider Note    Event Date/Time   First MD Initiated Contact with Patient 12/23/24 1936     (approximate)   History   Chest Pain   HPI  Dylan Holmes is a 59 y.o. male who presents to the emergency department today because of concerns for chest pain that occurred earlier in the day.  Located in the center part of his chest.  The time my exam he states he is feeling better.  He did however feel somewhat clammy when the chest pain occurred.  He underwent surgery roughly 3 weeks ago for abdominal hernia.  He states that he feels like that has been improving.  He denies any fevers or chills.     Physical Exam   Triage Vital Signs: ED Triage Vitals  Encounter Vitals Group     BP 12/23/24 1724 (!) 166/102     Girls Systolic BP Percentile --      Girls Diastolic BP Percentile --      Boys Systolic BP Percentile --      Boys Diastolic BP Percentile --      Pulse Rate 12/23/24 1724 84     Resp 12/23/24 1724 18     Temp 12/23/24 1727 98.5 F (36.9 C)     Temp Source 12/23/24 1727 Oral     SpO2 12/23/24 1724 96 %     Weight 12/23/24 1725 242 lb 8.1 oz (110 kg)     Height 12/23/24 1725 5' 11 (1.803 m)     Head Circumference --      Peak Flow --      Pain Score 12/23/24 1725 7     Pain Loc --      Pain Education --      Exclude from Growth Chart --     Most recent vital signs: Vitals:   12/23/24 1724 12/23/24 1727  BP: (!) 166/102   Pulse: 84   Resp: 18   Temp:  98.5 F (36.9 C)  SpO2: 96%    General: Awake, alert, oriented. CV:  Good peripheral perfusion. Regular rate and rhythm. Resp:  Normal effort. Lungs clear. Abd:  No distention. Minimally tender on the left side, incision sites c/d/i   ED Results / Procedures / Treatments   Labs (all labs ordered are listed, but only abnormal results are displayed) Labs Reviewed  BASIC METABOLIC PANEL WITH GFR - Abnormal; Notable for the following components:      Result Value    Glucose, Bld 100 (*)    All other components within normal limits  D-DIMER, QUANTITATIVE - Abnormal; Notable for the following components:   D-Dimer, Quant 0.79 (*)    All other components within normal limits  CBC  TROPONIN T, HIGH SENSITIVITY  TROPONIN T, HIGH SENSITIVITY     EKG  I, Guadalupe Eagles, attending physician, personally viewed and interpreted this EKG  EKG Time: 1722 Rate: 78 Rhythm: normal sinus rhythm Axis: normal Intervals: qtc 428 QRS: narrow ST changes: no st elevation, t wave inversion III Impression: abnormal ekg    RADIOLOGY I independently interpreted and visualized the CXR. My interpretation: No pneumonia Radiology interpretation:  IMPRESSION:  No active cardiopulmonary disease.     PROCEDURES:  Critical Care performed: No    MEDICATIONS ORDERED IN ED: Medications - No data to display   IMPRESSION / MDM / ASSESSMENT AND PLAN / ED COURSE  I reviewed the triage vital signs and the nursing notes.  Differential diagnosis includes, but is not limited to, pneumonia, CHF, ACS, PE  Patient's presentation is most consistent with acute presentation with potential threat to life or bodily function.  Patient presented to the emergency department today because of concerns for an episode of chest pain.  At the time my exam he is feeling better.  Blood work sent from triage was notable for negative troponin however slightly elevated D-dimer.  Because of this a CT scan was ordered.  Unfortunately patient was not able to tolerate scan.  Did repeat troponin which was stable.  I did have a discussion with the patient.  At this time cannot completely rule out blood clot however I would have low concern given the lack of hypoxia, continued symptoms.  Did discuss possibility of empirically starting blood thinners.  At this time patient felt comfortable with being discharged to further monitor his symptoms at home.  We did discuss  strict return precautions for any further episodes of chest pain, shortness of breath, cough.      FINAL CLINICAL IMPRESSION(S) / ED DIAGNOSES   Final diagnoses:  Nonspecific chest pain     Note:  This document was prepared using Dragon voice recognition software and may include unintentional dictation errors.    Floy Roberts, MD 12/23/24 [phone]  "
